# Patient Record
Sex: Male | Born: 1975 | Race: White | Hispanic: No | Marital: Single | Smoking: Never smoker
Health system: Southern US, Community
[De-identification: ages and names within clinical notes are randomized; demographics above are authoritative.]

## PROBLEM LIST (undated history)

## (undated) DIAGNOSIS — J45909 Unspecified asthma, uncomplicated: Secondary | ICD-10-CM

---

## 2014-10-17 ENCOUNTER — Emergency Department: Payer: Self-pay | Admitting: Emergency Medicine

## 2014-10-25 ENCOUNTER — Inpatient Hospital Stay (HOSPITAL_COMMUNITY)
Admission: EM | Admit: 2014-10-25 | Discharge: 2014-10-28 | DRG: 202 | Disposition: A | Discharge status: Home / Self Care | Payer: Self-pay | Attending: Internal Medicine | Admitting: Internal Medicine

## 2014-10-25 ENCOUNTER — Emergency Department (HOSPITAL_COMMUNITY): Payer: Self-pay

## 2014-10-25 ENCOUNTER — Encounter (HOSPITAL_COMMUNITY): Payer: Self-pay | Admitting: *Deleted

## 2014-10-25 DIAGNOSIS — R509 Fever, unspecified: Secondary | ICD-10-CM

## 2014-10-25 DIAGNOSIS — E876 Hypokalemia: Secondary | ICD-10-CM | POA: Diagnosis present

## 2014-10-25 DIAGNOSIS — J9601 Acute respiratory failure with hypoxia: Secondary | ICD-10-CM | POA: Diagnosis present

## 2014-10-25 DIAGNOSIS — Z791 Long term (current) use of non-steroidal anti-inflammatories (NSAID): Secondary | ICD-10-CM

## 2014-10-25 DIAGNOSIS — D72829 Elevated white blood cell count, unspecified: Secondary | ICD-10-CM | POA: Diagnosis present

## 2014-10-25 DIAGNOSIS — Z8249 Family history of ischemic heart disease and other diseases of the circulatory system: Secondary | ICD-10-CM

## 2014-10-25 DIAGNOSIS — J45901 Unspecified asthma with (acute) exacerbation: Secondary | ICD-10-CM | POA: Diagnosis present

## 2014-10-25 DIAGNOSIS — J40 Bronchitis, not specified as acute or chronic: Secondary | ICD-10-CM

## 2014-10-25 DIAGNOSIS — T380X5A Adverse effect of glucocorticoids and synthetic analogues, initial encounter: Secondary | ICD-10-CM | POA: Diagnosis present

## 2014-10-25 DIAGNOSIS — Z809 Family history of malignant neoplasm, unspecified: Secondary | ICD-10-CM

## 2014-10-25 DIAGNOSIS — J209 Acute bronchitis, unspecified: Principal | ICD-10-CM | POA: Diagnosis present

## 2014-10-25 DIAGNOSIS — Z23 Encounter for immunization: Secondary | ICD-10-CM

## 2014-10-25 HISTORY — DX: Unspecified asthma, uncomplicated: J45.909

## 2014-10-25 LAB — CBC WITH DIFFERENTIAL/PLATELET
BASOS ABS: 0 10*3/uL (ref 0.0–0.1)
BASOS PCT: 0 % (ref 0–1)
EOS PCT: 2 % (ref 0–5)
Eosinophils Absolute: 0.3 10*3/uL (ref 0.0–0.7)
HEMATOCRIT: 38.2 % — AB (ref 39.0–52.0)
Hemoglobin: 13.5 g/dL (ref 13.0–17.0)
Lymphocytes Relative: 8 % — ABNORMAL LOW (ref 12–46)
Lymphs Abs: 1 10*3/uL (ref 0.7–4.0)
MCH: 29.9 pg (ref 26.0–34.0)
MCHC: 35.3 g/dL (ref 30.0–36.0)
MCV: 84.5 fL (ref 78.0–100.0)
MONO ABS: 1.3 10*3/uL — AB (ref 0.1–1.0)
Monocytes Relative: 10 % (ref 3–12)
Neutro Abs: 11 10*3/uL — ABNORMAL HIGH (ref 1.7–7.7)
Neutrophils Relative %: 80 % — ABNORMAL HIGH (ref 43–77)
Platelets: 265 10*3/uL (ref 150–400)
RBC: 4.52 MIL/uL (ref 4.22–5.81)
RDW: 12.2 % (ref 11.5–15.5)
WBC: 13.6 10*3/uL — ABNORMAL HIGH (ref 4.0–10.5)

## 2014-10-25 LAB — COMPREHENSIVE METABOLIC PANEL
ALBUMIN: 2.9 g/dL — AB (ref 3.5–5.2)
ALT: 19 U/L (ref 0–53)
AST: 23 U/L (ref 0–37)
Alkaline Phosphatase: 49 U/L (ref 39–117)
Anion gap: 6 (ref 5–15)
BUN: 10 mg/dL (ref 6–23)
CALCIUM: 8.4 mg/dL (ref 8.4–10.5)
CO2: 29 mmol/L (ref 19–32)
Chloride: 105 mEq/L (ref 96–112)
Creatinine, Ser: 0.78 mg/dL (ref 0.50–1.35)
GFR calc Af Amer: >90 mL/min (ref >90)
GFR calc non Af Amer: >90 mL/min (ref >90)
Glucose, Bld: 102 mg/dL — ABNORMAL HIGH (ref 70–99)
Potassium: 3 mmol/L — ABNORMAL LOW (ref 3.5–5.1)
Sodium: 140 mmol/L (ref 135–145)
TOTAL PROTEIN: 6.9 g/dL (ref 6.0–8.3)
Total Bilirubin: 0.5 mg/dL (ref 0.3–1.2)

## 2014-10-25 LAB — TROPONIN I

## 2014-10-25 LAB — BRAIN NATRIURETIC PEPTIDE: B NATRIURETIC PEPTIDE 5: 95 pg/mL (ref 0.0–100.0)

## 2014-10-25 LAB — RAPID STREP SCREEN (MED CTR MEBANE ONLY): Streptococcus, Group A Screen (Direct): NEGATIVE

## 2014-10-25 MED ORDER — IPRATROPIUM-ALBUTEROL 0.5-2.5 (3) MG/3ML IN SOLN
3.0000 mL | Freq: Once | RESPIRATORY_TRACT | Status: AC
  Administered 2014-10-25: 3 mL via RESPIRATORY_TRACT
  Filled 2014-10-25: qty 3

## 2014-10-25 MED ORDER — POTASSIUM CHLORIDE CRYS ER 20 MEQ PO TBCR
40.0000 meq | EXTENDED_RELEASE_TABLET | Freq: Once | ORAL | Status: AC
  Administered 2014-10-26: 40 meq via ORAL
  Filled 2014-10-25: qty 2

## 2014-10-25 MED ORDER — LEVOFLOXACIN IN D5W 500 MG/100ML IV SOLN
500.0000 mg | Freq: Once | INTRAVENOUS | Status: AC
  Administered 2014-10-26: 500 mg via INTRAVENOUS
  Filled 2014-10-25: qty 100

## 2014-10-25 NOTE — ED Notes (Signed)
Pt dx with bronchitis last week at [place], + cough and fever at home, ibuprofen 400-600mg   An hour ago

## 2014-10-25 NOTE — ED Notes (Signed)
Walked patient around nurses station twice, sats dropped to 84% and heart rate was 134. Made doctor aware placed patient back on O2.

## 2014-10-25 NOTE — ED Provider Notes (Signed)
CSN: [id number]     Arrival date & time 10/25/14  1913 History  This chart was scribed for Glynn OctaveStephen Elisabetta Mishra, MD by Luisa DagoPriscilla Tutu, ED Scribe. This patient was seen in room APA17/APA17 and the patient's care was started at 8:10 PM.    Chief Complaint  Patient presents with  . Fever   HPI HPI Comments: Alton Revereimothy Gilcrest is a 38 y.o. male who presents to the Emergency Department complaining of gradual onset intermittent fever that started today. He states that he was diagnosed with mild bronchitis while he was at [facility] 1 week ago. Pt was discharged home with cough syrup (promethazine DM Syp) and Ibuprofen He endorses an associated cough. Last measured TMAX prior to arrival was 100. Current ED temperature is 98.6. He endorses associated upper abdominal pain secondary to forceful cough. Pt reports taking 400-600mg  Ibuprofen, with last dose approximately 1 hour ago. Positive sore throat, diaphoresis, and hoarseness. Denies any tobacco usage, recent travels outside the country, getting flu shot this season, HA, diarrhea, constipation, otalgia, visual disturbances, chest pain, or emesis.   Past Medical History  Diagnosis Date  . Asthma    History reviewed. No pertinent past surgical history. Family History  Problem Relation Age of Onset  . Hypertension Father   . Cancer Mother   . Heart attack Other    History  Substance Use Topics  . Smoking status: Never Smoker   . Smokeless tobacco: Not on file  . Alcohol Use: Yes    Review of Systems A complete 10 system review of systems was obtained and all systems are negative except as noted in the HPI and PMH.     Allergies  Review of patient's allergies indicates no known allergies.  Home Medications   Prior to Admission medications   Medication Sig Start Date End Date Taking? Authorizing Provider  DM-Doxylamine-Acetaminophen (NYQUIL COLD & FLU PO) Take 15 mLs by mouth daily as needed (for cough).   Yes Historical Provider, MD  ibuprofen  (ADVIL,MOTRIN) 200 MG tablet Take 800 mg by mouth every 6 (six) hours as needed for fever, mild pain or moderate pain.   Yes Historical Provider, MD  promethazine-dextromethorphan (PROMETHAZINE-DM) 6.25-15 MG/5ML syrup Take 5 mLs by mouth 4 (four) times daily as needed for cough.   Yes Historical Provider, MD   Triage Vitals: BP 146/82 mmHg  Pulse 108  Temp(Src) 98.6 F (37 C) (Oral)  Resp 24  Ht 5\' 8"  (1.727 m)  Wt 215 lb (97.523 kg)  BMI 32.70 kg/m2  SpO2 90%  Physical Exam  Constitutional: He is oriented to person, place, and time. He appears well-developed and well-nourished. No distress.  Hoarse voice  HENT:  Head: Normocephalic and atraumatic.  Mouth/Throat: Oropharynx is clear and moist. No oropharyngeal exudate.  Eyes: Conjunctivae and EOM are normal. Pupils are equal, round, and reactive to light.  Neck: Normal range of motion. Neck supple.  No meningismus.  Cardiovascular: Normal rate, regular rhythm, normal heart sounds and intact distal pulses.   No murmur heard. Pulmonary/Chest: Effort normal and breath sounds normal. No respiratory distress. He has no wheezes. He exhibits no tenderness.  Dry cough. No wheezing.  Abdominal: Soft. There is no tenderness. There is no rebound and no guarding.  Musculoskeletal: Normal range of motion. He exhibits no edema or tenderness.  Neurological: He is alert and oriented to person, place, and time. No cranial nerve deficit. He exhibits normal muscle tone. Coordination normal.  No ataxia on finger to nose bilaterally. No pronator drift.  5/5 strength throughout. CN 2-12 intact. Negative Romberg. Equal grip strength. Sensation intact. Gait is normal.   Skin: Skin is warm.  Psychiatric: He has a normal mood and affect. His behavior is normal.  Nursing note and vitals reviewed.   ED Course  Procedures (including critical care time)  DIAGNOSTIC STUDIES: Oxygen Saturation is 90% on RA, low by my interpretation.    COORDINATION OF  CARE: 8:14 PM-Will order CXR. Pt advised of plan for treatment and pt agrees.  Labs Review Labs Reviewed  CBC WITH DIFFERENTIAL - Abnormal; Notable for the following:    WBC 13.6 (*)    HCT 38.2 (*)    Neutrophils Relative % 80 (*)    Neutro Abs 11.0 (*)    Lymphocytes Relative 8 (*)    Monocytes Absolute 1.3 (*)    All other components within normal limits  COMPREHENSIVE METABOLIC PANEL - Abnormal; Notable for the following:    Potassium 3.0 (*)    Glucose, Bld 102 (*)    Albumin 2.9 (*)    All other components within normal limits  RAPID STREP SCREEN  CULTURE, GROUP A STREP  RESPIRATORY VIRUS PANEL  CULTURE, BLOOD (ROUTINE X 2)  CULTURE, BLOOD (ROUTINE X 2)  BRAIN NATRIURETIC PEPTIDE  TROPONIN I  MAGNESIUM  COMPREHENSIVE METABOLIC PANEL  CBC WITH DIFFERENTIAL    Imaging Review Dg Chest 2 View  10/25/2014   CLINICAL DATA:  Fever and nonproductive cough for 9 days. Patient reports a history of asthma.  EXAM: CHEST  2 VIEW  COMPARISON:  None.  FINDINGS: The heart size and mediastinal contours are within normal limits. Both lungs are clear. The visualized skeletal structures are unremarkable. Mild central bronchial wall thickening is evident.  IMPRESSION: Mild central bronchial wall thickening which may be related to the history of asthma or bronchitis.   Electronically Signed   By: Christiana PellantGretchen  Green M.D.   On: 10/25/2014 20:19     EKG Interpretation None      MDM   Final diagnoses:  Bronchitis  Asthma exacerbation   3 day history of cough, congestion, fever at home. Seen in [place] ER 2 days ago and diagnosed with bronchitis. Endorses chest pain with coughing.  Hoarse voice, lungs clear, no distress CXR with bronchial thickening.  Nebs, steroids.  Good air exchange but hypoxic to 90% at rest.  84% with ambulation.  With hypoxia, will admit for continued treatments and O2 supplements.  D/w Dr. Alvester MorinNewton.   I personally performed the services described in this  documentation, which was scribed in my presence. The recorded information has been reviewed and is accurate.   Glynn OctaveStephen Hutson Luft, MD 10/26/14 [phone]

## 2014-10-26 ENCOUNTER — Encounter (HOSPITAL_COMMUNITY): Payer: Self-pay

## 2014-10-26 DIAGNOSIS — J209 Acute bronchitis, unspecified: Secondary | ICD-10-CM | POA: Diagnosis present

## 2014-10-26 DIAGNOSIS — J9601 Acute respiratory failure with hypoxia: Secondary | ICD-10-CM | POA: Diagnosis present

## 2014-10-26 LAB — CBC WITH DIFFERENTIAL/PLATELET
BASOS ABS: 0 10*3/uL (ref 0.0–0.1)
BASOS PCT: 0 % (ref 0–1)
Eosinophils Absolute: 0 10*3/uL (ref 0.0–0.7)
Eosinophils Relative: 0 % (ref 0–5)
HEMATOCRIT: 38.2 % — AB (ref 39.0–52.0)
Hemoglobin: 13.6 g/dL (ref 13.0–17.0)
Lymphocytes Relative: 3 % — ABNORMAL LOW (ref 12–46)
Lymphs Abs: 0.4 10*3/uL — ABNORMAL LOW (ref 0.7–4.0)
MCH: 31 pg (ref 26.0–34.0)
MCHC: 35.6 g/dL (ref 30.0–36.0)
MCV: 87 fL (ref 78.0–100.0)
Monocytes Absolute: 0.1 10*3/uL (ref 0.1–1.0)
Monocytes Relative: 1 % — ABNORMAL LOW (ref 3–12)
NEUTROS ABS: 11.7 10*3/uL — AB (ref 1.7–7.7)
NEUTROS PCT: 96 % — AB (ref 43–77)
PLATELETS: 279 10*3/uL (ref 150–400)
RBC: 4.39 MIL/uL (ref 4.22–5.81)
RDW: 12.2 % (ref 11.5–15.5)
WBC: 12.1 10*3/uL — AB (ref 4.0–10.5)

## 2014-10-26 LAB — COMPREHENSIVE METABOLIC PANEL
ALBUMIN: 2.7 g/dL — AB (ref 3.5–5.2)
ALT: 18 U/L (ref 0–53)
AST: 28 U/L (ref 0–37)
Alkaline Phosphatase: 50 U/L (ref 39–117)
Anion gap: 10 (ref 5–15)
BUN: 10 mg/dL (ref 6–23)
CO2: 27 mmol/L (ref 19–32)
CREATININE: 0.71 mg/dL (ref 0.50–1.35)
Calcium: 8.3 mg/dL — ABNORMAL LOW (ref 8.4–10.5)
Chloride: 101 mEq/L (ref 96–112)
GFR calc Af Amer: >90 mL/min (ref >90)
Glucose, Bld: 225 mg/dL — ABNORMAL HIGH (ref 70–99)
Potassium: 3.3 mmol/L — ABNORMAL LOW (ref 3.5–5.1)
Sodium: 138 mmol/L (ref 135–145)
Total Bilirubin: 0.6 mg/dL (ref 0.3–1.2)
Total Protein: 6.9 g/dL (ref 6.0–8.3)

## 2014-10-26 LAB — MAGNESIUM: Magnesium: 1.5 mg/dL (ref 1.5–2.5)

## 2014-10-26 LAB — HEMOGLOBIN A1C
HEMOGLOBIN A1C: 6.1 % — AB (ref <5.7)
Mean Plasma Glucose: 128 mg/dL — ABNORMAL HIGH (ref <117)

## 2014-10-26 MED ORDER — IPRATROPIUM-ALBUTEROL 0.5-2.5 (3) MG/3ML IN SOLN: 3.0000 mL | RESPIRATORY_TRACT | Status: DC | PRN

## 2014-10-26 MED ORDER — IPRATROPIUM-ALBUTEROL 0.5-2.5 (3) MG/3ML IN SOLN: 3.0000 mL | RESPIRATORY_TRACT | Status: DC

## 2014-10-26 MED ORDER — IPRATROPIUM-ALBUTEROL 0.5-2.5 (3) MG/3ML IN SOLN
3.0000 mL | Freq: Four times a day (QID) | RESPIRATORY_TRACT | Status: DC
  Administered 2014-10-26 – 2014-10-28 (×9): 3 mL via RESPIRATORY_TRACT
  Filled 2014-10-26 (×9): qty 3

## 2014-10-26 MED ORDER — HEPARIN SODIUM (PORCINE) 5000 UNIT/ML IJ SOLN: 5000.0000 [IU] | Freq: Three times a day (TID) | INTRAMUSCULAR | Status: DC

## 2014-10-26 MED ORDER — PNEUMOCOCCAL VAC POLYVALENT 25 MCG/0.5ML IJ INJ
0.5000 mL | INJECTION | INTRAMUSCULAR | Status: AC
  Administered 2014-10-27: 0.5 mL via INTRAMUSCULAR
  Filled 2014-10-26: qty 0.5

## 2014-10-26 MED ORDER — LEVOFLOXACIN IN D5W 750 MG/150ML IV SOLN
750.0000 mg | INTRAVENOUS | Status: DC
  Administered 2014-10-26 – 2014-10-27 (×2): 750 mg via INTRAVENOUS
  Filled 2014-10-26 (×2): qty 150

## 2014-10-26 MED ORDER — POTASSIUM CHLORIDE CRYS ER 20 MEQ PO TBCR: 40.0000 meq | EXTENDED_RELEASE_TABLET | Freq: Two times a day (BID) | ORAL | Status: DC

## 2014-10-26 MED ORDER — HEPARIN SODIUM (PORCINE) 5000 UNIT/ML IJ SOLN
5000.0000 [IU] | Freq: Three times a day (TID) | INTRAMUSCULAR | Status: DC
  Administered 2014-10-26 – 2014-10-28 (×8): 5000 [IU] via SUBCUTANEOUS
  Filled 2014-10-26 (×8): qty 1

## 2014-10-26 MED ORDER — INFLUENZA VAC SPLIT QUAD 0.5 ML IM SUSY
0.5000 mL | PREFILLED_SYRINGE | INTRAMUSCULAR | Status: AC
  Administered 2014-10-27: 0.5 mL via INTRAMUSCULAR
  Filled 2014-10-26: qty 0.5

## 2014-10-26 MED ORDER — POTASSIUM CHLORIDE CRYS ER 20 MEQ PO TBCR
60.0000 meq | EXTENDED_RELEASE_TABLET | Freq: Once | ORAL | Status: AC
  Administered 2014-10-26: 60 meq via ORAL
  Filled 2014-10-26: qty 3

## 2014-10-26 MED ORDER — SODIUM CHLORIDE 0.9 % IV SOLN
INTRAVENOUS | Status: DC
  Administered 2014-10-26 (×2): via INTRAVENOUS

## 2014-10-26 MED ORDER — METHYLPREDNISOLONE SODIUM SUCC 125 MG IJ SOLR
125.0000 mg | Freq: Once | INTRAMUSCULAR | Status: AC
  Filled 2014-10-26: qty 2

## 2014-10-26 MED ORDER — METHYLPREDNISOLONE SODIUM SUCC 125 MG IJ SOLR
125.0000 mg | Freq: Two times a day (BID) | INTRAMUSCULAR | Status: DC
  Administered 2014-10-26 – 2014-10-27 (×4): 125 mg via INTRAVENOUS
  Filled 2014-10-26 (×3): qty 2

## 2014-10-26 MED ORDER — GUAIFENESIN-DM 100-10 MG/5ML PO SYRP
5.0000 mL | ORAL_SOLUTION | ORAL | Status: DC | PRN
  Administered 2014-10-26 – 2014-10-28 (×11): 5 mL via ORAL
  Filled 2014-10-26 (×11): qty 5

## 2014-10-26 MED ORDER — MORPHINE SULFATE 2 MG/ML IJ SOLN
1.0000 mg | INTRAMUSCULAR | Status: DC | PRN
  Administered 2014-10-26 – 2014-10-28 (×4): 1 mg via INTRAVENOUS
  Filled 2014-10-26 (×6): qty 1

## 2014-10-26 MED ORDER — GUAIFENESIN ER 600 MG PO TB12
1200.0000 mg | ORAL_TABLET | Freq: Two times a day (BID) | ORAL | Status: DC
  Administered 2014-10-26 – 2014-10-28 (×5): 1200 mg via ORAL
  Filled 2014-10-26 (×5): qty 2

## 2014-10-26 MED ORDER — LEVOFLOXACIN IN D5W 750 MG/150ML IV SOLN: 750.0000 mg | INTRAVENOUS | Status: DC

## 2014-10-26 NOTE — Care Management Note (Signed)
    Page 1 of 1   10/26/2014     11:44:01 AM CARE MANAGEMENT NOTE 10/26/2014  Patient:  Brad RevereQUINN,Brad   Account Number:  [id number]  Date Initiated:  10/26/2014  Documentation initiated by:  Sharrie RothmanBLACKWELL,Julez Huseby C  Subjective/Objective Assessment:   Pt admitted from home with respiratory failure and asthma. Pt just moved to area within the last month with family. Pt will discharge home. Pt has no insurance or PCP.     Action/Plan:   MATCH voucher will be left on pts chart for assistance with discharge meds. If pt needs neb machine [facility] Attempted to call [facility] for followup but office is closed for holiday. [facility] not [facility]   Anticipated DC Date:  10/27/2014   Anticipated DC Plan:  HOME/SELF CARE      DC Planning Services  CM consult  MATCH Program      Choice offered to / List presented to:             Status of service:  Completed, signed off Medicare Important Message given?   (If response is "NO", the following Medicare IM given date fields will be blank) Date Medicare IM given:   Medicare IM given by:   Date Additional Medicare IM given:   Additional Medicare IM given by:    Discharge Disposition:  HOME/SELF CARE  Per UR Regulation:    If discussed at Long Length of Stay Meetings, dates discussed:    Comments:  10/26/14 1115 Brad Strickland Brad Pepperman, RN BSN CM [place]. MD aware of barriers at discharge. MD does not anticipate pt will need home O2 at discharge.

## 2014-10-26 NOTE — H&P (Addendum)
Hospitalist Admission History and Physical  Patient name: Brad Strickland Medical record number: [id number] Date of birth: July 06, 1976 Age: 38 y.o. Gender: male  Primary Care Provider: No primary care provider on file.  Chief Complaint: acute resp failure w/ hypoxia, bronchitis, asthma exacerbation   History of Present Illness:This is a 38 y.o. year old male with significant past medical history of asthma presenting with acute resp failure w/ hypoxia, bronchitis, asthma exacerbation. Pt states that [facility] for [facility]. Had temp there >101. States that temp normalized and was d/c'd on ibuprofen and cough medicine. Denies being abx , steroid or inhaler. Reports remote hx/o asthma as a child. No recent inhaler use, though does report some intermittent wheezing at times. Worsening cough, wheezing and SOB at home. No flu shot this year. Nonsmoker.  Presented APER today T 98.6, HR 90s-100s, resp 20s, BP 130s-150s, Satting in low 90s on RA. Desatting to upper 80s on ambulation. WBC 13.6, hgb 13.5, Cr 0.78, K 3.0. CXR shows mild central bronchial thickening concerning for asthma/bronchitis. No true infiltrate.   Assessment and Plan: Brad Strickland is a 38 y.o. year old male presenting with Acute resp failure w/ hypoxia, bronchitis   Active Problems:   Bronchitis   Acute respiratory failure with hypoxia   1- Acute resp failure w/ hypoxia  -supplemental O2 prn  -IV solumedrol  -IV levaquin -Duonebs  -resp virus panel (no flu shot this year) -follow closely   2- Hypokalemia -replete  -check mag level   FEN/GI: heart healthy  Prophylaxis: sub q heparin  Disposition: pending further evaluation Code Status: Full Code    Patient Active Problem List   Diagnosis Date Noted  . Bronchitis 10/26/2014  . Acute respiratory failure with hypoxia 10/26/2014   Past Medical History: Past Medical History  Diagnosis Date  . Asthma     Past Surgical History: History reviewed.  No pertinent past surgical history.  Social History: History   Social History  . Marital Status: Single    Spouse Name: N/A    Number of Children: N/A  . Years of Education: N/A   Social History Main Topics  . Smoking status: Never Smoker   . Smokeless tobacco: None  . Alcohol Use: Yes  . Drug Use: No  . Sexual Activity: None   Other Topics Concern  . None   Social History Narrative  . None    Family History: History reviewed. No pertinent family history.  Allergies: No Known Allergies  Current Facility-Administered Medications  Medication Dose Route Frequency Provider Last Rate Last Dose  . 0.9 %  sodium chloride infusion   Intravenous Continuous Doree AlbeeSteven Kanitra Purifoy, MD      . heparin injection 5,000 Units  5,000 Units Subcutaneous 3 times per day Doree AlbeeSteven Jimesha Rising, MD      . ipratropium-albuterol (DUONEB) 0.5-2.5 (3) MG/3ML nebulizer solution 3 mL  3 mL Nebulization Q4H Doree AlbeeSteven Aurelius Gildersleeve, MD      . ipratropium-albuterol (DUONEB) 0.5-2.5 (3) MG/3ML nebulizer solution 3 mL  3 mL Nebulization Q2H PRN Doree AlbeeSteven Celisa Schoenberg, MD      . levofloxacin (LEVAQUIN) IVPB 500 mg  500 mg Intravenous Once Glynn OctaveStephen Rancour, MD      . levofloxacin (LEVAQUIN) IVPB 750 mg  750 mg Intravenous Q24H Doree AlbeeSteven Glynda Soliday, MD      . methylPREDNISolone sodium succinate (SOLU-MEDROL) 125 mg/2 mL injection 125 mg  125 mg Intravenous Once Glynn OctaveStephen Rancour, MD      . methylPREDNISolone sodium succinate (SOLU-MEDROL) 125  mg/2 mL injection 125 mg  125 mg Intravenous Q12H Doree AlbeeSteven Beyza Bellino, MD      . potassium chloride SA (K-DUR,KLOR-CON) CR tablet 40 mEq  40 mEq Oral Once Glynn OctaveStephen Rancour, MD       Current Outpatient Prescriptions  Medication Sig Dispense Refill  . DM-Doxylamine-Acetaminophen (NYQUIL COLD & FLU PO) Take 15 mLs by mouth daily as needed (for cough).    Marland Kitchen. ibuprofen (ADVIL,MOTRIN) 200 MG tablet Take 800 mg by mouth every 6 (six) hours as needed for fever, mild pain or moderate pain.    . promethazine-dextromethorphan  (PROMETHAZINE-DM) 6.25-15 MG/5ML syrup Take 5 mLs by mouth 4 (four) times daily as needed for cough.     Review Of Systems: 12 point ROS negative except as noted above in HPI.  Physical Exam: Filed Vitals:   10/25/14 2319  BP: 152/96  Pulse: 94  Temp:   Resp: 22    General: alert and cooperative HEENT: PERRLA, extra ocular movement intact and hoarse voice Heart: S1, S2 normal, no murmur, rub or gallop, regular rate and rhythm Lungs: faint wheezes diffusely, minimal increased WOB  Abdomen: abdomen is soft without significant tenderness, masses, organomegaly or guarding Extremities: extremities normal, atraumatic, no cyanosis or edema Skin:no rashes, no ecchymoses Neurology: normal without focal findings  Labs and Imaging: Lab Results  Component Value Date/Time   NA 140 10/25/2014 08:32 PM   K 3.0* 10/25/2014 08:32 PM   CL 105 10/25/2014 08:32 PM   CO2 29 10/25/2014 08:32 PM   BUN 10 10/25/2014 08:32 PM   CREATININE 0.78 10/25/2014 08:32 PM   GLUCOSE 102* 10/25/2014 08:32 PM   Lab Results  Component Value Date   WBC 13.6* 10/25/2014   HGB 13.5 10/25/2014   HCT 38.2* 10/25/2014   MCV 84.5 10/25/2014   PLT 265 10/25/2014    Dg Chest 2 View  10/25/2014   CLINICAL DATA:  Fever and nonproductive cough for 9 days. Patient reports a history of asthma.  EXAM: CHEST  2 VIEW  COMPARISON:  None.  FINDINGS: The heart size and mediastinal contours are within normal limits. Both lungs are clear. The visualized skeletal structures are unremarkable. Mild central bronchial wall thickening is evident.  IMPRESSION: Mild central bronchial wall thickening which may be related to the history of asthma or bronchitis.   Electronically Signed   By: Christiana PellantGretchen  Green M.D.   On: 10/25/2014 20:19           Doree AlbeeSteven Alam Guterrez MD  Pager: [phone]

## 2014-10-26 NOTE — Progress Notes (Signed)
UR completed 

## 2014-10-26 NOTE — Progress Notes (Signed)
[facility] PROGRESS NOTE   Brad Strickland AVW:[id number] DOB: August 19, 1976 DOA: 10/25/2014 PCP: No primary care provider on file.  HPI/Subjective: Low-grade fever of 99.7, still has cough, shortness of breath and mild wheezing.  Assessment/Plan: Active Problems:   Bronchitis   Acute respiratory failure with hypoxia    Acute hypoxic respiratory failure Secondary to acute bronchitis/early pneumonia. Patient oxygen saturation dropped to 84% with ambulation and heart rate of 134. Currently on 2 L of oxygen saturating well, treat acute bronchitis/pneumonia.  Acute bronchitis Patient had symptoms for about 9-10 days, with low-grade temperature, cough and sputum production. Started on levofloxacin. Urine for Legionella and Streptococcus antigen. Supportive treatment with bronchodilators, antitussives, oxygen and mucolytics.  Code Status: Full code Family Communication: Plan discussed with the patient. Disposition Plan: Remains inpatient   Consultants:  None  Procedures:  None  Antibiotics:  Levofloxacin   Objective: Filed Vitals:   10/26/14 0609  BP: 141/66  Pulse: 82  Temp: 98.6 F (37 C)  Resp: 20    Intake/Output Summary (Last 24 hours) at 10/26/14 1033 Last data filed at 10/26/14 0954  Gross per 24 hour  Intake  727.5 ml  Output      0 ml  Net  727.5 ml   Filed Weights   10/25/14 1926  Weight: 97.523 kg (215 lb)    Exam: General: Alert and awake, oriented x3, not in any acute distress. HEENT: anicteric sclera, pupils reactive to light and accommodation, EOMI CVS: S1-S2 clear, no murmur rubs or gallops Chest: clear to auscultation bilaterally, no wheezing, rales or rhonchi Abdomen: soft nontender, nondistended, normal bowel sounds, no organomegaly Extremities: no cyanosis, clubbing or edema noted bilaterally Neuro: Cranial nerves II-XII intact, no focal neurological deficits  Data Reviewed: Basic Metabolic Panel:  Recent Labs Lab  10/25/14 2032 10/26/14 0049 10/26/14 0628  NA 140  --  138  K 3.0*  --  3.3*  CL 105  --  101  CO2 29  --  27  GLUCOSE 102*  --  225*  BUN 10  --  10  CREATININE 0.78  --  0.71  CALCIUM 8.4  --  8.3*  MG  --  1.5  --    Liver Function Tests:  Recent Labs Lab 10/25/14 2032 10/26/14 0628  AST 23 28  ALT 19 18  ALKPHOS 49 50  BILITOT 0.5 0.6  PROT 6.9 6.9  ALBUMIN 2.9* 2.7*   No results for input(s): LIPASE, AMYLASE in the last 168 hours. No results for input(s): AMMONIA in the last 168 hours. CBC:  Recent Labs Lab 10/25/14 2032 10/26/14 0628  WBC 13.6* 12.1*  NEUTROABS 11.0* 11.7*  HGB 13.5 13.6  HCT 38.2* 38.2*  MCV 84.5 87.0  PLT 265 279   Cardiac Enzymes:  Recent Labs Lab 10/25/14 2032  TROPONINI <0.03   BNP (last 3 results) No results for input(s): PROBNP in the last 8760 hours. CBG: No results for input(s): GLUCAP in the last 168 hours.  Micro Recent Results (from the past 240 hour(s))  Rapid strep screen     Status: None   Collection Time: 10/25/14  8:21 PM  Result Value Ref Range Status   Streptococcus, Group Strickland Screen (Direct) NEGATIVE NEGATIVE Final    Comment: (NOTE) Strickland Rapid Antigen test may result negative if the antigen level in the sample is below the detection level of this test. The FDA has not cleared this test as Strickland stand-alone test therefore the rapid antigen negative result has reflexed  to Strickland Group Strickland Strep culture.   Culture, blood (routine x 2)     Status: None (Preliminary result)   Collection Time: 10/26/14 12:49 AM  Result Value Ref Range Status   Specimen Description BLOOD RIGHT ANTECUBITAL  Final   Special Requests   Final    BOTTLES DRAWN AEROBIC AND ANAEROBIC AEB 8CC ANA 6CC   Culture NO GROWTH <24 HRS  Final   Report Status PENDING  Incomplete  Culture, blood (routine x 2)     Status: None (Preliminary result)   Collection Time: 10/26/14 12:49 AM  Result Value Ref Range Status   Specimen Description BLOOD RIGHT HAND   Final   Special Requests BOTTLES DRAWN AEROBIC AND ANAEROBIC 6CC EACH  Final   Culture NO GROWTH <24 HRS  Final   Report Status PENDING  Incomplete     Studies: Dg Chest 2 View  10/25/2014   CLINICAL DATA:  Fever and nonproductive cough for 9 days. Patient reports Strickland history of asthma.  EXAM: CHEST  2 VIEW  COMPARISON:  None.  FINDINGS: The heart size and mediastinal contours are within normal limits. Both lungs are clear. The visualized skeletal structures are unremarkable. Mild central bronchial wall thickening is evident.  IMPRESSION: Mild central bronchial wall thickening which may be related to the history of asthma or bronchitis.   Electronically Signed   By: Christiana PellantGretchen  Strickland M.D.   On: 10/25/2014 20:19    Scheduled Meds: . heparin  5,000 Units Subcutaneous 3 times per day  . [START ON 10/27/2014] Influenza vac split quadrivalent PF  0.5 mL Intramuscular Tomorrow-1000  . ipratropium-albuterol  3 mL Nebulization Q6H  . levofloxacin (LEVAQUIN) IV  750 mg Intravenous Q24H  . methylPREDNISolone (SOLU-MEDROL) injection  125 mg Intravenous Q12H  . [START ON 10/27/2014] pneumococcal 23 valent vaccine  0.5 mL Intramuscular Tomorrow-1000  . potassium chloride  40 mEq Oral BID   Continuous Infusions: . sodium chloride 75 mL/hr at 10/26/14 0151       Time spent: 35 minutes    [facility] Hospital[facility]MAHI,Brad Strickland  [facility] Pager [phone] If 7PM-7AM, please contact night-coverage at [email or web address], password [email or web address] 10/26/2014, 10:33 AM  LOS: 1 day

## 2014-10-27 DIAGNOSIS — J209 Acute bronchitis, unspecified: Principal | ICD-10-CM

## 2014-10-27 DIAGNOSIS — J9601 Acute respiratory failure with hypoxia: Secondary | ICD-10-CM

## 2014-10-27 DIAGNOSIS — D72829 Elevated white blood cell count, unspecified: Secondary | ICD-10-CM | POA: Diagnosis present

## 2014-10-27 LAB — CBC WITH DIFFERENTIAL/PLATELET
Basophils Absolute: 0.1 10*3/uL (ref 0.0–0.1)
Basophils Relative: 0 % (ref 0–1)
EOS ABS: 0 10*3/uL (ref 0.0–0.7)
Eosinophils Relative: 0 % (ref 0–5)
HCT: 37.4 % — ABNORMAL LOW (ref 39.0–52.0)
HEMOGLOBIN: 13.7 g/dL (ref 13.0–17.0)
LYMPHS ABS: 1.2 10*3/uL (ref 0.7–4.0)
LYMPHS PCT: 4 % — AB (ref 12–46)
MCH: 32.5 pg (ref 26.0–34.0)
MCHC: 36.6 g/dL — ABNORMAL HIGH (ref 30.0–36.0)
MCV: 88.6 fL (ref 78.0–100.0)
MONOS PCT: 4 % (ref 3–12)
Monocytes Absolute: 1.4 10*3/uL — ABNORMAL HIGH (ref 0.1–1.0)
NEUTROS PCT: 92 % — AB (ref 43–77)
Neutro Abs: 30.4 10*3/uL — ABNORMAL HIGH (ref 1.7–7.7)
PLATELETS: 346 10*3/uL (ref 150–400)
RBC: 4.22 MIL/uL (ref 4.22–5.81)
RDW: 12.6 % (ref 11.5–15.5)
WBC: 33 10*3/uL — AB (ref 4.0–10.5)

## 2014-10-27 LAB — COMPREHENSIVE METABOLIC PANEL
ALT: 22 U/L (ref 0–53)
AST: 36 U/L (ref 0–37)
Albumin: 2.6 g/dL — ABNORMAL LOW (ref 3.5–5.2)
Alkaline Phosphatase: 52 U/L (ref 39–117)
Anion gap: 10 (ref 5–15)
BUN: 15 mg/dL (ref 6–23)
CALCIUM: 8.7 mg/dL (ref 8.4–10.5)
CO2: 25 mmol/L (ref 19–32)
CREATININE: 0.75 mg/dL (ref 0.50–1.35)
Chloride: 102 mEq/L (ref 96–112)
GFR calc Af Amer: >90 mL/min (ref >90)
Glucose, Bld: 147 mg/dL — ABNORMAL HIGH (ref 70–99)
Potassium: 4 mmol/L (ref 3.5–5.1)
Sodium: 137 mmol/L (ref 135–145)
Total Bilirubin: 0.7 mg/dL (ref 0.3–1.2)
Total Protein: 6.5 g/dL (ref 6.0–8.3)

## 2014-10-27 LAB — CULTURE, GROUP A STREP

## 2014-10-27 MED ORDER — ACETAMINOPHEN 325 MG PO TABS
650.0000 mg | ORAL_TABLET | Freq: Four times a day (QID) | ORAL | Status: DC | PRN
  Administered 2014-10-27: 650 mg via ORAL
  Filled 2014-10-27: qty 2

## 2014-10-27 MED ORDER — METHYLPREDNISOLONE SODIUM SUCC 125 MG IJ SOLR
60.0000 mg | Freq: Two times a day (BID) | INTRAMUSCULAR | Status: DC
  Administered 2014-10-28 (×2): 60 mg via INTRAVENOUS
  Filled 2014-10-27 (×2): qty 2

## 2014-10-27 MED ORDER — ONDANSETRON HCL 4 MG/2ML IJ SOLN: 4.0000 mg | Freq: Four times a day (QID) | INTRAMUSCULAR | Status: DC | PRN

## 2014-10-27 NOTE — Progress Notes (Signed)
[facility] PROGRESS NOTE  Brad Revereimothy Ferdig RUE:[id number] DOB: Apr 13, 1976 DOA: 10/25/2014 PCP: No primary care provider on file.  Assessment/Plan: 1. Acute hypoxic respiratory failure. Felt to be related to acute bronchitis. Continue to wean oxygen as tolerated. 2. Acute bronchitis. Continue on levofloxacin. Continue supportive measures including bronchodilators, antitussives, oxygen and mucolytics. He is also on steroids which we will start to de-escalate 3. Leukocytosis. Likely related to steroids. It does not appear to be septic or toxic.  Code Status: Full code Family Communication: Discussed with patient  Disposition Plan: Discharge home, hopefully in a.m.   Consultants:    Procedures:    Antibiotics:  Levofloxacin 12/24>>  HPI/Subjective: Still feels short of breath. Has difficulty expectorating sputum  Objective: Filed Vitals:   10/27/14 0617  BP: 139/72  Pulse: 88  Temp: 97.8 F (36.6 C)  Resp: 20    Intake/Output Summary (Last 24 hours) at 10/27/14 1852 Last data filed at 10/27/14 0900  Gross per 24 hour  Intake    240 ml  Output    700 ml  Net   -460 ml   Filed Weights   10/25/14 1926  Weight: 97.523 kg (215 lb)    Exam:   General:  No acute distress  Cardiovascular: S1, S2, regular rate and rhythm  Respiratory: Crackles at left base  Abdomen: Soft, nontender, positive bowel sounds  Musculoskeletal: No pedal edema   Data Reviewed: Basic Metabolic Panel:  Recent Labs Lab 10/25/14 2032 10/26/14 0049 10/26/14 0628 10/27/14 0637  NA 140  --  138 137  K 3.0*  --  3.3* 4.0  CL 105  --  101 102  CO2 29  --  27 25  GLUCOSE 102*  --  225* 147*  BUN 10  --  10 15  CREATININE 0.78  --  0.71 0.75  CALCIUM 8.4  --  8.3* 8.7  MG  --  1.5  --   --    Liver Function Tests:  Recent Labs Lab 10/25/14 2032 10/26/14 0628 10/27/14 0637  AST 23 28 36  ALT 19 18 22   ALKPHOS 49 50 52  BILITOT 0.5 0.6 0.7  PROT 6.9 6.9 6.5  ALBUMIN  2.9* 2.7* 2.6*   No results for input(s): LIPASE, AMYLASE in the last 168 hours. No results for input(s): AMMONIA in the last 168 hours. CBC:  Recent Labs Lab 10/25/14 2032 10/26/14 0628 10/27/14 0637  WBC 13.6* 12.1* 33.0*  NEUTROABS 11.0* 11.7* 30.4*  HGB 13.5 13.6 13.7  HCT 38.2* 38.2* 37.4*  MCV 84.5 87.0 88.6  PLT 265 279 346   Cardiac Enzymes:  Recent Labs Lab 10/25/14 2032  TROPONINI <0.03   BNP (last 3 results) No results for input(s): PROBNP in the last 8760 hours. CBG: No results for input(s): GLUCAP in the last 168 hours.  Recent Results (from the past 240 hour(s))  Rapid strep screen     Status: None   Collection Time: 10/25/14  8:21 PM  Result Value Ref Range Status   Streptococcus, Group A Screen (Direct) NEGATIVE NEGATIVE Final    Comment: (NOTE) A Rapid Antigen test may result negative if the antigen level in the sample is below the detection level of this test. The FDA has not cleared this test as a stand-alone test therefore the rapid antigen negative result has reflexed to a Group A Strep culture.   Culture, Group A Strep     Status: None   Collection Time: 10/25/14  8:21 PM  Result  Value Ref Range Status   Specimen Description THROAT  Final   Special Requests NONE  Final   Culture   Final    No Beta Hemolytic Streptococci Isolated Performed at [facility]    Report Status 10/27/2014 FINAL  Final  Culture, blood (routine x 2)     Status: None (Preliminary result)   Collection Time: 10/26/14 12:49 AM  Result Value Ref Range Status   Specimen Description BLOOD RIGHT ANTECUBITAL  Final   Special Requests   Final    BOTTLES DRAWN AEROBIC AND ANAEROBIC AEB 8CC ANA 6CC   Culture NO GROWTH <24 HRS  Final   Report Status PENDING  Incomplete  Culture, blood (routine x 2)     Status: None (Preliminary result)   Collection Time: 10/26/14 12:49 AM  Result Value Ref Range Status   Specimen Description BLOOD RIGHT HAND  Final   Special  Requests BOTTLES DRAWN AEROBIC AND ANAEROBIC 6CC EACH  Final   Culture NO GROWTH <24 HRS  Final   Report Status PENDING  Incomplete     Studies: Dg Chest 2 View  10/25/2014   CLINICAL DATA:  Fever and nonproductive cough for 9 days. Patient reports a history of asthma.  EXAM: CHEST  2 VIEW  COMPARISON:  None.  FINDINGS: The heart size and mediastinal contours are within normal limits. Both lungs are clear. The visualized skeletal structures are unremarkable. Mild central bronchial wall thickening is evident.  IMPRESSION: Mild central bronchial wall thickening which may be related to the history of asthma or bronchitis.   Electronically Signed   By: Christiana PellantGretchen  Green M.D.   On: 10/25/2014 20:19    Scheduled Meds: . guaiFENesin  1,200 mg Oral BID  . heparin  5,000 Units Subcutaneous 3 times per day  . ipratropium-albuterol  3 mL Nebulization Q6H  . levofloxacin (LEVAQUIN) IV  750 mg Intravenous Q24H  . [START ON 10/28/2014] methylPREDNISolone (SOLU-MEDROL) injection  60 mg Intravenous Q12H   Continuous Infusions:   Principal Problem:   Acute respiratory failure with hypoxia Active Problems:   Acute bronchitis   Leukocytosis    Time spent: 30min    MEMON,JEHANZEB  [facility] Pager [phone]. If 7PM-7AM, please contact night-coverage at [email or web address], password [email or web address] 10/27/2014, 6:52 PM  LOS: 2 days

## 2014-10-27 NOTE — Plan of Care (Signed)
Problem: Phase I Progression Outcomes Goal: Flu/PneumoVaccines if indicated Outcome: Completed/Met Date Met:  10/27/14 Both given today 10/27/14

## 2014-10-28 LAB — COMPREHENSIVE METABOLIC PANEL
ALK PHOS: 43 U/L (ref 39–117)
ALT: 29 U/L (ref 0–53)
AST: 28 U/L (ref 0–37)
Albumin: 2.5 g/dL — ABNORMAL LOW (ref 3.5–5.2)
Anion gap: 7 (ref 5–15)
BUN: 17 mg/dL (ref 6–23)
CO2: 29 mmol/L (ref 19–32)
Calcium: 8.5 mg/dL (ref 8.4–10.5)
Chloride: 103 mEq/L (ref 96–112)
Creatinine, Ser: 0.78 mg/dL (ref 0.50–1.35)
GLUCOSE: 138 mg/dL — AB (ref 70–99)
Potassium: 3.8 mmol/L (ref 3.5–5.1)
Sodium: 139 mmol/L (ref 135–145)
Total Bilirubin: 0.4 mg/dL (ref 0.3–1.2)
Total Protein: 5.8 g/dL — ABNORMAL LOW (ref 6.0–8.3)

## 2014-10-28 LAB — CBC WITH DIFFERENTIAL/PLATELET
BASOS ABS: 0 10*3/uL (ref 0.0–0.1)
Basophils Relative: 0 % (ref 0–1)
EOS ABS: 0 10*3/uL (ref 0.0–0.7)
EOS PCT: 0 % (ref 0–5)
HCT: 36 % — ABNORMAL LOW (ref 39.0–52.0)
Hemoglobin: 12.6 g/dL — ABNORMAL LOW (ref 13.0–17.0)
LYMPHS ABS: 1 10*3/uL (ref 0.7–4.0)
Lymphocytes Relative: 5 % — ABNORMAL LOW (ref 12–46)
MCH: 30.5 pg (ref 26.0–34.0)
MCHC: 35 g/dL (ref 30.0–36.0)
MCV: 87.2 fL (ref 78.0–100.0)
MONO ABS: 1 10*3/uL (ref 0.1–1.0)
Monocytes Relative: 5 % (ref 3–12)
NEUTROS PCT: 90 % — AB (ref 43–77)
Neutro Abs: 17.4 10*3/uL — ABNORMAL HIGH (ref 1.7–7.7)
PLATELETS: 305 10*3/uL (ref 150–400)
RBC: 4.13 MIL/uL — ABNORMAL LOW (ref 4.22–5.81)
RDW: 12.7 % (ref 11.5–15.5)
WBC: 19.4 10*3/uL — ABNORMAL HIGH (ref 4.0–10.5)

## 2014-10-28 MED ORDER — PREDNISONE 10 MG PO TABS: ORAL_TABLET | ORAL | Status: AC

## 2014-10-28 MED ORDER — SODIUM CHLORIDE 0.9 % IJ SOLN
3.0000 mL | Freq: Two times a day (BID) | INTRAMUSCULAR | Status: DC
  Administered 2014-10-28: 3 mL via INTRAVENOUS

## 2014-10-28 MED ORDER — HYDROCODONE-ACETAMINOPHEN 5-325 MG PO TABS
1.0000 | ORAL_TABLET | ORAL | Status: AC
  Administered 2014-10-28: 1 via ORAL
  Filled 2014-10-28: qty 1

## 2014-10-28 MED ORDER — LEVOFLOXACIN 750 MG PO TABS: 750.0000 mg | ORAL_TABLET | Freq: Every day | ORAL | Status: AC

## 2014-10-28 MED ORDER — HYDROCODONE-HOMATROPINE 5-1.5 MG/5ML PO SYRP: 5.0000 mL | ORAL_SOLUTION | Freq: Four times a day (QID) | ORAL | Status: AC | PRN

## 2014-10-28 MED ORDER — ALBUTEROL SULFATE HFA 108 (90 BASE) MCG/ACT IN AERS: 2.0000 | INHALATION_SPRAY | RESPIRATORY_TRACT | Status: AC | PRN

## 2014-10-28 MED ORDER — SODIUM CHLORIDE 0.9 % IJ SOLN
3.0000 mL | INTRAMUSCULAR | Status: DC | PRN
  Administered 2014-10-28 (×2): 3 mL via INTRAVENOUS
  Filled 2014-10-28 (×2): qty 3

## 2014-10-28 MED ORDER — SODIUM CHLORIDE 0.9 % IV SOLN: 250.0000 mL | INTRAVENOUS | Status: DC | PRN

## 2014-10-28 MED ORDER — GUAIFENESIN ER 600 MG PO TB12: 600.0000 mg | ORAL_TABLET | Freq: Two times a day (BID) | ORAL | Status: AC

## 2014-10-28 NOTE — Progress Notes (Signed)
Discharge instructions and prescriptions given, verbalized understanding, out in stable condition ambulatory with family. 

## 2014-10-28 NOTE — Progress Notes (Signed)
SATURATION QUALIFICATIONS: (This note is used to comply with regulatory documentation for home oxygen)  Patient Saturations on Room Air at Rest = 92%  Patient Saturations on Room Air while Ambulating = 89-93%  Patient Saturations on 0 Liters of oxygen while Ambulating =93%  Please briefly explain why patient needs home oxygen:

## 2014-10-29 NOTE — Discharge Summary (Signed)
Physician Discharge Summary  Alton Revereimothy Dzik ZOX:[id number] DOB: 16-Jan-1976 DOA: 10/25/2014  PCP: No primary care provider on file.  Admit date: 10/25/2014 Discharge date: 10/28/2014  Time spent: 40 minutes  Recommendations for Outpatient Follow-up:  1. Follow up with primary care physician in 1-2 weeks  Discharge Diagnoses:  Principal Problem:   Acute respiratory failure with hypoxia Active Problems:   Acute bronchitis   Leukocytosis   Discharge Condition: improved  Diet recommendation: regular diet  Filed Weights   10/25/14 1926  Weight: 97.523 kg (215 lb)    History of present illness:  This is a 38 y/o male who presented to the hospital with worsening cough, wheezing and shortness of breath. He was found to have an acute bronchitis with no clear infiltrate on chest xray. He had some mild hypoxia on ambulation. He was admitted to the hospital for further management  Hospital Course:  Patient was treated with bronchodilators, steroids and antibiotics. He was given mucolytics and his condition slowly improved. He did not have any fevers during his hospital stay. At the time of discharge, he was able to ambulate around the nursing unit on room air without any significant dyspnea. Oxygen saturations stayed in acceptable range. Patient feels ready for discharge home. He will be started on a prednisone taper, receive prescription for inhaler and antibiotics. He has gained maximal benefit from hospitalization.  Procedures:    Consultations:    Discharge Exam: Filed Vitals:   10/28/14 1400  BP: 121/65  Pulse: 100  Temp: 98.2 F (36.8 C)  Resp: 18    General: NAD Cardiovascular: s1, s2, rrr Respiratory: cta b with no wheezing  Discharge Instructions   Discharge Instructions    Call MD for:  difficulty breathing, headache or visual disturbances    Complete by:  As directed      Call MD for:  severe uncontrolled pain    Complete by:  As directed      Call MD for:   temperature >100.4    Complete by:  As directed      Diet - low sodium heart healthy    Complete by:  As directed      Increase activity slowly    Complete by:  As directed           Discharge Medication List as of 10/28/2014  6:38 PM    START taking these medications   Details  albuterol (PROVENTIL HFA;VENTOLIN HFA) 108 (90 BASE) MCG/ACT inhaler Inhale 2 puffs into the lungs every 4 (four) hours as needed for wheezing or shortness of breath., Starting 10/28/2014, Until Discontinued, Print    guaiFENesin (MUCINEX) 600 MG 12 hr tablet Take 1 tablet (600 mg total) by mouth 2 (two) times daily., Starting 10/28/2014, Until Discontinued, Normal    HYDROcodone-homatropine (HYCODAN) 5-1.5 MG/5ML syrup Take 5 mLs by mouth every 6 (six) hours as needed for cough., Starting 10/28/2014, Until Discontinued, Print    levofloxacin (LEVAQUIN) 750 MG tablet Take 1 tablet (750 mg total) by mouth daily., Starting 10/28/2014, Until Discontinued, Print    predniSONE (DELTASONE) 10 MG tablet Take 40mg  po daily for 2 days then 30mg  po daily for 2 days then 20mg  po daily for 2 days then 10mg  po daily for 2 days then stop, Print      CONTINUE these medications which have NOT CHANGED   Details  ibuprofen (ADVIL,MOTRIN) 200 MG tablet Take 800 mg by mouth every 6 (six) hours as needed for fever, mild pain or moderate pain.,  Until Discontinued, Historical Med    promethazine-dextromethorphan (PROMETHAZINE-DM) 6.25-15 MG/5ML syrup Take 5 mLs by mouth 4 (four) times daily as needed for cough., Until Discontinued, Historical Med      STOP taking these medications     DM-Doxylamine-Acetaminophen (NYQUIL COLD & FLU PO)        No Known Allergies Follow-up Information    Follow up with follow up with primary care doctor in 1-2 weeks.       The results of significant diagnostics from this hospitalization (including imaging, microbiology, ancillary and laboratory) are listed below for reference.     Significant Diagnostic Studies: Dg Chest 2 View  10/25/2014   CLINICAL DATA:  Fever and nonproductive cough for 9 days. Patient reports a history of asthma.  EXAM: CHEST  2 VIEW  COMPARISON:  None.  FINDINGS: The heart size and mediastinal contours are within normal limits. Both lungs are clear. The visualized skeletal structures are unremarkable. Mild central bronchial wall thickening is evident.  IMPRESSION: Mild central bronchial wall thickening which may be related to the history of asthma or bronchitis.   Electronically Signed   By: Christiana PellantGretchen  Green M.D.   On: 10/25/2014 20:19    Microbiology: Recent Results (from the past 240 hour(s))  Rapid strep screen     Status: None   Collection Time: 10/25/14  8:21 PM  Result Value Ref Range Status   Streptococcus, Group A Screen (Direct) NEGATIVE NEGATIVE Final    Comment: (NOTE) A Rapid Antigen test may result negative if the antigen level in the sample is below the detection level of this test. The FDA has not cleared this test as a stand-alone test therefore the rapid antigen negative result has reflexed to a Group A Strep culture.   Culture, Group A Strep     Status: None   Collection Time: 10/25/14  8:21 PM  Result Value Ref Range Status   Specimen Description THROAT  Final   Special Requests NONE  Final   Culture   Final    No Beta Hemolytic Streptococci Isolated [facility]    Report Status 10/27/2014 FINAL  Final  Culture, blood (routine x 2)     Status: None (Preliminary result)   Collection Time: 10/26/14 12:49 AM  Result Value Ref Range Status   Specimen Description BLOOD RIGHT ANTECUBITAL  Final   Special Requests   Final    BOTTLES DRAWN AEROBIC AND ANAEROBIC AEB=8CC ANA=6CC   Culture NO GROWTH 2 DAYS  Final   Report Status PENDING  Incomplete  Culture, blood (routine x 2)     Status: None (Preliminary result)   Collection Time: 10/26/14 12:49 AM  Result Value Ref Range Status   Specimen  Description BLOOD RIGHT HAND  Final   Special Requests BOTTLES DRAWN AEROBIC AND ANAEROBIC [facility]6CC EACH  Final   Culture NO GROWTH 2 DAYS  Final   Report Status PENDING  Incomplete     Labs: Basic Metabolic Panel:  Recent Labs Lab 10/25/14 2032 10/26/14 0049 10/26/14 0628 10/27/14 0637 10/28/14 0647  NA 140  --  138 137 139  K 3.0*  --  3.3* 4.0 3.8  CL 105  --  101 102 103  CO2 29  --  27 25 29   GLUCOSE 102*  --  225* 147* 138*  BUN 10  --  10 15 17   CREATININE 0.78  --  0.71 0.75 0.78  CALCIUM 8.4  --  8.3* 8.7 8.5  MG  --  1.5  --   --   --    Liver Function Tests:  Recent Labs Lab 10/25/14 2032 10/26/14 0628 10/27/14 0637 10/28/14 0647  AST 23 28 36 28  ALT 19 18 22 29   ALKPHOS 49 50 52 43  BILITOT 0.5 0.6 0.7 0.4  PROT 6.9 6.9 6.5 5.8*  ALBUMIN 2.9* 2.7* 2.6* 2.5*   No results for input(s): LIPASE, AMYLASE in the last 168 hours. No results for input(s): AMMONIA in the last 168 hours. CBC:  Recent Labs Lab 10/25/14 2032 10/26/14 0628 10/27/14 0637 10/28/14 0647  WBC 13.6* 12.1* 33.0* 19.4*  NEUTROABS 11.0* 11.7* 30.4* 17.4*  HGB 13.5 13.6 13.7 12.6*  HCT 38.2* 38.2* 37.4* 36.0*  MCV 84.5 87.0 88.6 87.2  PLT 265 279 346 305   Cardiac Enzymes:  Recent Labs Lab 10/25/14 2032  TROPONINI <0.03   BNP: BNP (last 3 results) No results for input(s): PROBNP in the last 8760 hours. CBG: No results for input(s): GLUCAP in the last 168 hours.     Signed:  MEMON,JEHANZEB  [facility] 10/29/2014, 1:32 AM

## 2014-10-31 LAB — RESPIRATORY VIRUS PANEL
ADENOVIRUS: NOT DETECTED
INFLUENZA A H1: NOT DETECTED
INFLUENZA A H3: NOT DETECTED
INFLUENZA A: NOT DETECTED
INFLUENZA B 1: NOT DETECTED
METAPNEUMOVIRUS: NOT DETECTED
PARAINFLUENZA 3 A: NOT DETECTED
Parainfluenza 1: NOT DETECTED
Parainfluenza 2: NOT DETECTED
RESPIRATORY SYNCYTIAL VIRUS B: NOT DETECTED
RHINOVIRUS: NOT DETECTED
Respiratory Syncytial Virus A: NOT DETECTED

## 2014-10-31 LAB — CULTURE, BLOOD (ROUTINE X 2)
Culture: NO GROWTH
Culture: NO GROWTH

## 2014-12-28 ENCOUNTER — Emergency Department (HOSPITAL_COMMUNITY): Payer: Self-pay

## 2014-12-28 ENCOUNTER — Emergency Department (HOSPITAL_COMMUNITY)
Admission: EM | Admit: 2014-12-28 | Discharge: 2014-12-28 | Disposition: A | Discharge status: Home / Self Care | Payer: Self-pay | Attending: Emergency Medicine | Admitting: Emergency Medicine

## 2014-12-28 ENCOUNTER — Encounter (HOSPITAL_COMMUNITY): Payer: Self-pay | Admitting: Emergency Medicine

## 2014-12-28 DIAGNOSIS — J45909 Unspecified asthma, uncomplicated: Secondary | ICD-10-CM | POA: Insufficient documentation

## 2014-12-28 DIAGNOSIS — Z7952 Long term (current) use of systemic steroids: Secondary | ICD-10-CM | POA: Insufficient documentation

## 2014-12-28 DIAGNOSIS — Z792 Long term (current) use of antibiotics: Secondary | ICD-10-CM | POA: Insufficient documentation

## 2014-12-28 DIAGNOSIS — K088 Other specified disorders of teeth and supporting structures: Secondary | ICD-10-CM | POA: Insufficient documentation

## 2014-12-28 DIAGNOSIS — Z79899 Other long term (current) drug therapy: Secondary | ICD-10-CM | POA: Insufficient documentation

## 2014-12-28 DIAGNOSIS — L988 Other specified disorders of the skin and subcutaneous tissue: Secondary | ICD-10-CM | POA: Insufficient documentation

## 2014-12-28 DIAGNOSIS — K029 Dental caries, unspecified: Secondary | ICD-10-CM | POA: Insufficient documentation

## 2014-12-28 DIAGNOSIS — B349 Viral infection, unspecified: Secondary | ICD-10-CM

## 2014-12-28 DIAGNOSIS — K0889 Other specified disorders of teeth and supporting structures: Secondary | ICD-10-CM

## 2014-12-28 LAB — RAPID STREP SCREEN (MED CTR MEBANE ONLY): Streptococcus, Group A Screen (Direct): NEGATIVE

## 2014-12-28 MED ORDER — ACETAMINOPHEN-CODEINE #3 300-30 MG PO TABS: 1.0000 | ORAL_TABLET | Freq: Four times a day (QID) | ORAL | Status: AC | PRN

## 2014-12-28 MED ORDER — IBUPROFEN 800 MG PO TABS
800.0000 mg | ORAL_TABLET | Freq: Once | ORAL | Status: AC
  Administered 2014-12-28: 800 mg via ORAL
  Filled 2014-12-28: qty 1

## 2014-12-28 MED ORDER — IBUPROFEN 800 MG PO TABS: 800.0000 mg | ORAL_TABLET | Freq: Three times a day (TID) | ORAL | Status: DC

## 2014-12-28 MED ORDER — AMOXICILLIN 500 MG PO CAPS
500.0000 mg | ORAL_CAPSULE | Freq: Three times a day (TID) | ORAL | Status: AC
Start: 2014-12-28 — End: ?

## 2014-12-28 NOTE — Discharge Instructions (Signed)
Your strep test is negative. Your chest x-ray is negative for acute event. Your examination suggest viral illness with fever blisters, oral ulcers, and toothache. Please use ibuprofen 3 times daily with food, and Amoxil 3 times daily with food for your tooth pain. Use Tylenol codeine for severe pain. It important that you see a dentist as soon as possible. Tylenol codeine may cause drowsiness, please use with caution. Please apply Orajel to the fever blisters on your lip and inside your mouth. This is contagious, please use her mask until symptoms have resolved. Please wash hands frequently, and please do not share eating utensils. Viral Infections A virus is a type of germ. Viruses can cause:  Minor sore throats.  Aches and pains.  Headaches.  Runny nose.  Rashes.  Watery eyes.  Tiredness.  Coughs.  Loss of appetite.  Feeling sick to your stomach (nausea).  Throwing up (vomiting).  Watery poop (diarrhea). HOME CARE   Only take medicines as told by your doctor.  Drink enough water and fluids to keep your pee (urine) clear or pale yellow. Sports drinks are a good choice.  Get plenty of rest and eat healthy. Soups and broths with crackers or rice are fine. GET HELP RIGHT AWAY IF:   You have a very bad headache.  You have shortness of breath.  You have chest pain or neck pain.  You have an unusual rash.  You cannot stop throwing up.  You have watery poop that does not stop.  You cannot keep fluids down.  You or your child has a temperature by mouth above 102 F (38.9 C), not controlled by medicine.  Your baby is older than 3 months with a rectal temperature of 102 F (38.9 C) or higher.  Your baby is 53 months old or younger with a rectal temperature of 100.4 F (38 C) or higher. MAKE SURE YOU:   Understand these instructions.  Will watch this condition.  Will get help right away if you are not doing well or get worse. Document Released: 10/02/2008  Document Revised: 01/12/2012 Document Reviewed: 02/25/2011 [facility] Patient Information 2015 [place], [place]. This information is not intended to replace advice given to you by your health care provider. Make sure you discuss any questions you have with your health care provider.

## 2014-12-28 NOTE — ED Notes (Signed)
Coughing x 1 week, this week start  having blister on lips, teeth and gums hurting, fever

## 2014-12-28 NOTE — ED Provider Notes (Signed)
CSN: [id number]     Arrival date & time 12/28/14  1206 History   First MD Initiated Contact with Patient 12/28/14 1324     Chief Complaint  Patient presents with  . Cough  . Blister    on mouth     (Consider location/radiation/quality/duration/timing/severity/associated sxs/prior Treatment) HPI Comments: Patient is a 39 year old male who presents to the emergency department with a complaint of cough and generally not feeling well.  The patient states for the past week he has been having problems with cough, blisters on his lip, toothache, and pain in his gum area. He is also noticed some fever subjectively. The patient states he's had nausea, but no vomiting. He has not had any diarrhea, or unusual rash. There been no hot joints reported. The patient states he has not traveled outside of the country recently. He has a history of asthma, but no other problems. He has received only minimal relief from ibuprofen up to this point.  Patient is a 39 y.o. male presenting with cough. The history is provided by the patient.  Cough Associated symptoms: sore throat     Past Medical History  Diagnosis Date  . Asthma    History reviewed. No pertinent past surgical history. Family History  Problem Relation Age of Onset  . Hypertension Father   . Cancer Mother   . Heart attack Other    History  Substance Use Topics  . Smoking status: Never Smoker   . Smokeless tobacco: Not on file  . Alcohol Use: Yes     Comment: occ    Review of Systems  HENT: Positive for dental problem, mouth sores and sore throat.   Respiratory: Positive for cough.   All other systems reviewed and are negative.     Allergies  Review of patient's allergies indicates no known allergies.  Home Medications   Prior to Admission medications   Medication Sig Start Date End Date Taking? Authorizing Provider  albuterol (PROVENTIL HFA;VENTOLIN HFA) 108 (90 BASE) MCG/ACT inhaler Inhale 2 puffs into the lungs every 4  (four) hours as needed for wheezing or shortness of breath. 10/28/14   Erick BlinksJehanzeb Memon, MD  guaiFENesin (MUCINEX) 600 MG 12 hr tablet Take 1 tablet (600 mg total) by mouth 2 (two) times daily. 10/28/14   Erick BlinksJehanzeb Memon, MD  HYDROcodone-homatropine (HYCODAN) 5-1.5 MG/5ML syrup Take 5 mLs by mouth every 6 (six) hours as needed for cough. 10/28/14   Erick BlinksJehanzeb Memon, MD  ibuprofen (ADVIL,MOTRIN) 200 MG tablet Take 800 mg by mouth every 6 (six) hours as needed for fever, mild pain or moderate pain.    Historical Provider, MD  levofloxacin (LEVAQUIN) 750 MG tablet Take 1 tablet (750 mg total) by mouth daily. 10/28/14   Erick BlinksJehanzeb Memon, MD  predniSONE (DELTASONE) 10 MG tablet Take 40mg  po daily for 2 days then 30mg  po daily for 2 days then 20mg  po daily for 2 days then 10mg  po daily for 2 days then stop 10/28/14   Erick BlinksJehanzeb Memon, MD  promethazine-dextromethorphan (PROMETHAZINE-DM) 6.25-15 MG/5ML syrup Take 5 mLs by mouth 4 (four) times daily as needed for cough.    Historical Provider, MD   BP 137/72 mmHg  Pulse 100  Temp(Src) 101.4 F (38.6 C) (Oral)  Resp 18  Ht 5\' 9"  (1.753 m)  Wt 179 lb (81.194 kg)  BMI 26.42 kg/m2  SpO2 98% Physical Exam  Constitutional: He is oriented to person, place, and time. He appears well-developed and well-nourished.  Non-toxic appearance.  HENT:  Head:  Normocephalic.  Right Ear: Tympanic membrane and external ear normal.  Left Ear: Tympanic membrane and external ear normal.  There are fever blisters of the upper lip. There are blisters noted of the posterior pharynx. The uvula is in the midline, but with increased redness and some swelling. There is increased redness of the posterior pharynx.  Dental caries noted. No abscess. No swelling under the tongue.  Eyes: EOM and lids are normal. Pupils are equal, round, and reactive to light.  Neck: Normal range of motion. Neck supple. Carotid bruit is not present.  Cardiovascular: Normal rate, regular rhythm, normal heart  sounds, intact distal pulses and normal pulses.   Pulmonary/Chest: Breath sounds normal. No respiratory distress.  Abdominal: Soft. Bowel sounds are normal. There is no tenderness. There is no guarding.  Musculoskeletal: Normal range of motion.  Lymphadenopathy:       Head (right side): No submandibular adenopathy present.       Head (left side): No submandibular adenopathy present.    He has no cervical adenopathy.  Neurological: He is alert and oriented to person, place, and time. He has normal strength. No cranial nerve deficit or sensory deficit.  Skin: Skin is warm and dry.  Psychiatric: He has a normal mood and affect. His speech is normal.  Nursing note and vitals reviewed.   ED Course  Procedures (including critical care time) Labs Review Labs Reviewed  RAPID STREP SCREEN    Imaging Review Dg Chest 2 View  12/28/2014   CLINICAL DATA:  Cough for 1 week, fever  EXAM: CHEST  2 VIEW  COMPARISON:  10/25/2014  FINDINGS: Cardiomediastinal silhouette is stable. No acute infiltrate or pleural effusion. No pulmonary edema. Mild gaseous distension of splenic flexure of the colon.  IMPRESSION: No active disease. Mild gaseous distension of splenic flexure of the colon.   Electronically Signed   By: Natasha Mead M.D.   On: 12/28/2014 13:32     EKG Interpretation None      MDM  Pt has had a cough for a week. Temp elevated. Pulse ox 98% on room air. Pt also has dental issues, but no evidence for Ludwig's Angina. Chest xray reveals no active disease. Strep screen is negative.  Pt made aware of findings. Pt to follow up with primary MD and see a dentist as soon as possible. Rx for amoxil, tylenol #3, and ibuprofen given to the patient.    Final diagnoses:  Viral illness  Toothache    *I have reviewed nursing notes, vital signs, and all appropriate lab and imaging results for this patient.[address] PA-C 12/30/14 1509  Audree Camel, MD 01/03/15 [phone]

## 2014-12-30 LAB — CULTURE, GROUP A STREP: Strep A Culture: NEGATIVE

## 2018-04-27 ENCOUNTER — Emergency Department (HOSPITAL_COMMUNITY)
Admission: EM | Admit: 2018-04-27 | Discharge: 2018-04-27 | Disposition: A | Discharge status: Home / Self Care | Payer: 59 | Attending: Emergency Medicine | Admitting: Emergency Medicine

## 2018-04-27 ENCOUNTER — Other Ambulatory Visit: Payer: Self-pay

## 2018-04-27 ENCOUNTER — Encounter (HOSPITAL_COMMUNITY): Payer: Self-pay | Admitting: Emergency Medicine

## 2018-04-27 DIAGNOSIS — J45909 Unspecified asthma, uncomplicated: Secondary | ICD-10-CM | POA: Diagnosis not present

## 2018-04-27 DIAGNOSIS — K029 Dental caries, unspecified: Secondary | ICD-10-CM | POA: Diagnosis not present

## 2018-04-27 DIAGNOSIS — K0889 Other specified disorders of teeth and supporting structures: Secondary | ICD-10-CM | POA: Diagnosis present

## 2018-04-27 DIAGNOSIS — Z79899 Other long term (current) drug therapy: Secondary | ICD-10-CM | POA: Insufficient documentation

## 2018-04-27 DIAGNOSIS — K047 Periapical abscess without sinus: Secondary | ICD-10-CM | POA: Diagnosis not present

## 2018-04-27 MED ORDER — KETOROLAC TROMETHAMINE 60 MG/2ML IM SOLN
60.0000 mg | Freq: Once | INTRAMUSCULAR | Status: AC
  Administered 2018-04-27: 60 mg via INTRAMUSCULAR
  Filled 2018-04-27: qty 2

## 2018-04-27 MED ORDER — OXYCODONE-ACETAMINOPHEN 5-325 MG PO TABS
1.0000 | ORAL_TABLET | Freq: Once | ORAL | Status: DC
  Filled 2018-04-27: qty 1

## 2018-04-27 MED ORDER — IBUPROFEN 800 MG PO TABS: 800.0000 mg | ORAL_TABLET | Freq: Three times a day (TID) | ORAL | 0 refills | Status: AC

## 2018-04-27 MED ORDER — PENICILLIN V POTASSIUM 250 MG PO TABS
500.0000 mg | ORAL_TABLET | Freq: Once | ORAL | Status: AC
  Administered 2018-04-27: 500 mg via ORAL
  Filled 2018-04-27: qty 2

## 2018-04-27 MED ORDER — PENICILLIN V POTASSIUM 500 MG PO TABS: 500.0000 mg | ORAL_TABLET | Freq: Three times a day (TID) | ORAL | 0 refills | Status: AC

## 2018-04-27 NOTE — ED Triage Notes (Signed)
Pt c/o right lower jaw pain with swelling since yesterday.

## 2018-04-27 NOTE — ED Provider Notes (Signed)
[facility] EMERGENCY DEPARTMENT Provider Note   CSN: [id number] Arrival date & time: 04/27/18  2014     History   Chief Complaint Chief Complaint  Patient presents with  . Dental Pain    HPI Brad Strickland is a 42 y.o. male.  The history is provided by the patient.  Dental Pain   This is a new problem. The current episode started yesterday. The problem occurs constantly. The problem has been gradually worsening. The pain is moderate. Treatments tried: ibuprofen.    Past Medical History:  Diagnosis Date  . Asthma     Patient Active Problem List   Diagnosis Date Noted  . Leukocytosis 10/27/2014  . Acute bronchitis 10/26/2014  . Acute respiratory failure with hypoxia (HCC) 10/26/2014    History reviewed. No pertinent surgical history.      Home Medications    Prior to Admission medications   Medication Sig Start Date End Date Taking? Authorizing Provider  acetaminophen-codeine (TYLENOL #3) 300-30 MG per tablet Take 1-2 tablets by mouth every 6 (six) hours as needed. 12/28/14   Ivery Quale, PA-C  albuterol (PROVENTIL HFA;VENTOLIN HFA) 108 (90 BASE) MCG/ACT inhaler Inhale 2 puffs into the lungs every 4 (four) hours as needed for wheezing or shortness of breath. 10/28/14   Erick Blinks, MD  amoxicillin (AMOXIL) 500 MG capsule Take 1 capsule (500 mg total) by mouth 3 (three) times daily. 12/28/14   Ivery Quale, PA-C  guaiFENesin (MUCINEX) 600 MG 12 hr tablet Take 1 tablet (600 mg total) by mouth 2 (two) times daily. 10/28/14   Erick Blinks, MD  HYDROcodone-homatropine (HYCODAN) 5-1.5 MG/5ML syrup Take 5 mLs by mouth every 6 (six) hours as needed for cough. 10/28/14   Erick Blinks, MD  ibuprofen (ADVIL,MOTRIN) 800 MG tablet Take 1 tablet (800 mg total) by mouth 3 (three) times daily. 04/27/18   Ayvah Caroll, Barbara Cower, MD  levofloxacin (LEVAQUIN) 750 MG tablet Take 1 tablet (750 mg total) by mouth daily. 10/28/14   Erick Blinks, MD  penicillin v potassium (VEETID) 500  MG tablet Take 1 tablet (500 mg total) by mouth 3 (three) times daily. 04/27/18   Isobel Eisenhuth, Barbara Cower, MD  predniSONE (DELTASONE) 10 MG tablet Take 40mg  po daily for 2 days then 30mg  po daily for 2 days then 20mg  po daily for 2 days then 10mg  po daily for 2 days then stop 10/28/14   Erick Blinks, MD  promethazine-dextromethorphan (PROMETHAZINE-DM) 6.25-15 MG/5ML syrup Take 5 mLs by mouth 4 (four) times daily as needed for cough.    [provider]    Family History Family History  Problem Relation Age of Onset  . Hypertension Father   . Cancer Mother   . Heart attack Other     Social History Social History   Tobacco Use  . Smoking status: Never Smoker  . Smokeless tobacco: Never Used  Substance Use Topics  . Alcohol use: Yes    Comment: occ  . Drug use: No     Allergies   Patient has no known allergies.   Review of Systems Review of Systems  All other systems reviewed and are negative.    Physical Exam Updated Vital Signs BP 102/74 (BP Location: Right Arm)   Pulse 99   Temp 97.8 F (36.6 C) (Temporal)   Resp 14   Ht 5\' 9"  (1.753 m)   Wt 93.4 kg (206 lb)   SpO2 97%   BMI 30.42 kg/m   Physical Exam  Constitutional: He is oriented to person,  place, and time. He appears well-developed and well-nourished.  HENT:  Head: Normocephalic and atraumatic.  Mouth/Throat:    Tenderness below second molar on right lower jaw without fluctuance.  No sublingual induration.   Eyes: Conjunctivae and EOM are normal.  Neck: Normal range of motion.  Cardiovascular: Normal rate.  Pulmonary/Chest: Effort normal. No respiratory distress.  Abdominal: Soft. He exhibits no distension.  Musculoskeletal: Normal range of motion.  Neurological: He is alert and oriented to person, place, and time.  Skin: Skin is warm.  Nursing note and vitals reviewed.    ED Treatments / Results  Labs (all labs ordered are listed, but only abnormal results are displayed) Labs Reviewed - No  data to display  EKG None  Radiology No results found.  Procedures Procedures (including critical care time)  Medications Ordered in ED Medications  oxyCODONE-acetaminophen (PERCOCET/ROXICET) 5-325 MG per tablet 1 tablet (1 tablet Oral Not Given 04/27/18 2105)  ketorolac (TORADOL) injection 60 mg (60 mg Intramuscular Given 04/27/18 2104)  penicillin v potassium (VEETID) tablet 500 mg (500 mg Oral Given 04/27/18 2104)     Initial Impression / Assessment and Plan / ED Course  I have reviewed the triage vital signs and the nursing notes.  Pertinent labs & imaging results that were available during my care of the patient were reviewed by me and considered in my medical decision making (see chart for details).     Likely dental infection, will treat for same. No ludwigs. No airway compromise. No other complications. Will start abx, suggest dental follow up.   Final Clinical Impressions(s) / ED Diagnoses   Final diagnoses:  Dental caries  Dental infection    ED Discharge Orders        Ordered    penicillin v potassium (VEETID) 500 MG tablet  3 times daily     04/27/18 2053    ibuprofen (ADVIL,MOTRIN) 800 MG tablet  3 times daily     04/27/18 2053       Odelia Graciano, Barbara CowerJason, MD 04/27/18 2113

## 2019-03-10 ENCOUNTER — Other Ambulatory Visit: Payer: Self-pay

## 2019-03-10 ENCOUNTER — Emergency Department (HOSPITAL_BASED_OUTPATIENT_CLINIC_OR_DEPARTMENT_OTHER): Payer: 59

## 2019-03-10 ENCOUNTER — Encounter (HOSPITAL_BASED_OUTPATIENT_CLINIC_OR_DEPARTMENT_OTHER): Payer: Self-pay

## 2019-03-10 ENCOUNTER — Emergency Department (HOSPITAL_BASED_OUTPATIENT_CLINIC_OR_DEPARTMENT_OTHER)
Admission: EM | Admit: 2019-03-10 | Discharge: 2019-03-10 | Disposition: A | Discharge status: Home / Self Care | Payer: 59 | Attending: Emergency Medicine | Admitting: Emergency Medicine

## 2019-03-10 DIAGNOSIS — Z20822 Contact with and (suspected) exposure to covid-19: Secondary | ICD-10-CM

## 2019-03-10 DIAGNOSIS — R0602 Shortness of breath: Secondary | ICD-10-CM | POA: Insufficient documentation

## 2019-03-10 DIAGNOSIS — Z20828 Contact with and (suspected) exposure to other viral communicable diseases: Secondary | ICD-10-CM | POA: Insufficient documentation

## 2019-03-10 DIAGNOSIS — R6889 Other general symptoms and signs: Principal | ICD-10-CM

## 2019-03-10 DIAGNOSIS — J45909 Unspecified asthma, uncomplicated: Secondary | ICD-10-CM | POA: Insufficient documentation

## 2019-03-10 DIAGNOSIS — Z79899 Other long term (current) drug therapy: Secondary | ICD-10-CM | POA: Diagnosis not present

## 2019-03-10 DIAGNOSIS — R05 Cough: Secondary | ICD-10-CM | POA: Insufficient documentation

## 2019-03-10 MED ORDER — BENZONATATE 100 MG PO CAPS: 100.0000 mg | ORAL_CAPSULE | Freq: Three times a day (TID) | ORAL | 0 refills | Status: AC

## 2019-03-10 NOTE — ED Triage Notes (Signed)
C/o cough, decreased sense smell and tastes x 2 days-pt with covid 19 + co-worker-states he was sent by employer-pt had evist yesterday with dx allergies-rx inhaler, flonase and zyrtec-NAD-steady gait

## 2019-03-10 NOTE — ED Notes (Signed)
Pt denies any fevers, states he is unsure as to when he was exposed at work to [employer], but that 4 other coworkers also tested positive

## 2019-03-10 NOTE — ED Provider Notes (Signed)
[facility] EMERGENCY DEPARTMENT Provider Note   CSN: [id number] Arrival date & time: 03/10/19  1725    History   Chief Complaint Chief Complaint  Patient presents with  . Cough    HPI Brad Strickland is a 43 y.o. male.     43 yo M with a chief complaint of cough and shortness of breath.  This been off and on for the past couple weeks.  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  His employer found out that he had been coughing and sent him here for evaluation.  He denies any fevers.  Denies nausea vomiting diarrhea denies congestion denies sore throat denies ear pain.  Denies other exposure.  The history is provided by the patient.  Cough  Cough characteristics:  Non-productive Associated symptoms: shortness of breath   Associated symptoms: no chest pain, no chills, no eye discharge, no fever, no headaches, no myalgias and no rash   Illness  Severity:  Mild Onset quality:  Sudden Duration:  2 weeks Timing:  Constant Progression:  Worsening Chronicity:  New Associated symptoms: cough and shortness of breath   Associated symptoms: no abdominal pain, no chest pain, no congestion, no diarrhea, no fever, no headaches, no myalgias, no rash and no vomiting     Past Medical History:  Diagnosis Date  . Asthma     Patient Active Problem List   Diagnosis Date Noted  . Leukocytosis 10/27/2014  . Acute bronchitis 10/26/2014  . Acute respiratory failure with hypoxia (HCC) 10/26/2014    History reviewed. No pertinent surgical history.      Home Medications    Prior to Admission medications   Medication Sig Start Date End Date Taking? Authorizing Provider  acetaminophen-codeine (TYLENOL #3) 300-30 MG per tablet Take 1-2 tablets by mouth every 6 (six) hours as needed. 12/28/14   Ivery Quale, PA-C  albuterol (PROVENTIL HFA;VENTOLIN HFA) 108 (90 BASE) MCG/ACT inhaler  Inhale 2 puffs into the lungs every 4 (four) hours as needed for wheezing or shortness of breath. 10/28/14   Erick Blinks, MD  amoxicillin (AMOXIL) 500 MG capsule Take 1 capsule (500 mg total) by mouth 3 (three) times daily. 12/28/14   Ivery Quale, PA-C  guaiFENesin (MUCINEX) 600 MG 12 hr tablet Take 1 tablet (600 mg total) by mouth 2 (two) times daily. 10/28/14   Erick Blinks, MD  HYDROcodone-homatropine (HYCODAN) 5-1.5 MG/5ML syrup Take 5 mLs by mouth every 6 (six) hours as needed for cough. 10/28/14   Erick Blinks, MD  ibuprofen (ADVIL,MOTRIN) 800 MG tablet Take 1 tablet (800 mg total) by mouth 3 (three) times daily. 04/27/18   Mesner, Barbara Cower, MD  levofloxacin (LEVAQUIN) 750 MG tablet Take 1 tablet (750 mg total) by mouth daily. 10/28/14   Erick Blinks, MD  penicillin v potassium (VEETID) 500 MG tablet Take 1 tablet (500 mg total) by mouth 3 (three) times daily. 04/27/18   Mesner, Barbara Cower, MD  predniSONE (DELTASONE) 10 MG tablet Take 40mg  po daily for 2 days then 30mg  po daily for 2 days then 20mg  po daily for 2 days then 10mg  po daily for 2 days then stop 10/28/14   Erick Blinks, MD  promethazine-dextromethorphan (PROMETHAZINE-DM) 6.25-15 MG/5ML syrup Take 5 mLs by mouth 4 (four) times daily as needed for cough.    [provider]    Family History Family History  Problem Relation Age of Onset  .  Hypertension Father   . Cancer Mother   . Heart attack Other     Social History Social History   Tobacco Use  . Smoking status: Never Smoker  . Smokeless tobacco: Never Used  Substance Use Topics  . Alcohol use: Yes    Comment: occ  . Drug use: No     Allergies   Patient has no known allergies.   Review of Systems Review of Systems  Constitutional: Negative for chills and fever.  HENT: Negative for congestion and facial swelling.   Eyes: Negative for discharge and visual disturbance.  Respiratory: Positive for cough and shortness of breath.   Cardiovascular:  Negative for chest pain and palpitations.  Gastrointestinal: Negative for abdominal pain, diarrhea and vomiting.  Musculoskeletal: Negative for arthralgias and myalgias.  Skin: Negative for color change and rash.  Neurological: Negative for tremors, syncope and headaches.  Psychiatric/Behavioral: Negative for confusion and dysphoric mood.     Physical Exam Updated Vital Signs BP (!) 147/89 (BP Location: Right Arm)   Pulse 65   Temp 98.2 F (36.8 C)   Resp 16   Ht 5\' 9"  (1.753 m)   Wt 96.2 kg   SpO2 100%   BMI 31.31 kg/m   Physical Exam Vitals signs and nursing note reviewed.  Constitutional:      Appearance: He is well-developed.  HENT:     Head: Normocephalic and atraumatic.  Eyes:     Pupils: Pupils are equal, round, and reactive to light.  Neck:     Musculoskeletal: Normal range of motion and neck supple.     Vascular: No JVD.  Cardiovascular:     Rate and Rhythm: Normal rate and regular rhythm.     Heart sounds: No murmur. No friction rub. No gallop.   Pulmonary:     Effort: No respiratory distress.     Breath sounds: No wheezing.  Abdominal:     General: There is no distension.     Tenderness: There is no guarding or rebound.  Musculoskeletal: Normal range of motion.  Skin:    Coloration: Skin is not pale.     Findings: No rash.  Neurological:     Mental Status: He is alert and oriented to person, place, and time.  Psychiatric:        Behavior: Behavior normal.      ED Treatments / Results  Labs (all labs ordered are listed, but only abnormal results are displayed) Labs Reviewed  NOVEL CORONAVIRUS, NAA (HOSPITAL ORDER, SEND-OUT TO REF LAB)    EKG None  Radiology No results found.  Procedures Procedures (including critical care time)  Medications Ordered in ED Medications - No data to display   Initial Impression / Assessment and Plan / ED Course  I have reviewed the triage vital signs and the nursing notes.  Pertinent labs & imaging  results that were available during my care of the patient were reviewed by me and considered in my medical decision making (see chart for details).        43 yo M with a chief complaint of a known exposure to someone with the novel coronavirus and cough and shortness of breath.  Patient is well-appearing and nontoxic he is satting 100% on room air there is no tachypnea.  No tachycardia.  We will send an outpatient test.  Chest x-ray.  Tessalon Perles.  Chest x-ray viewed by me without focal infiltrate. The patient will follow-up as an outpatient.  7:03 PM:  I have  discussed the diagnosis/risks/treatment options with the patient and believe the pt to be eligible for discharge home to follow-up with PCP. We also discussed returning to the ED immediately if new or worsening sx occur. We discussed the sx which are most concerning (e.g., sudden worsening sob) that necessitate immediate return. Medications administered to the patient during their visit and any new prescriptions provided to the patient are listed below.  Medications given during this visit Medications - No data to display   The patient appears reasonably screen and/or stabilized for discharge and I doubt any other medical condition or other [facility] requiring further screening, evaluation, or treatment in the ED at this time prior to discharge.    Final Clinical Impressions(s) / ED Diagnoses   Final diagnoses:  None    ED Discharge Orders    None       Melene Plan, DO 03/10/19 1903

## 2019-03-10 NOTE — ED Notes (Signed)
Pt verbalizes understanding of d/c instructions and denies any further needs at this time. 

## 2019-03-10 NOTE — ED Notes (Signed)
Filled out CDC paperwork, pt signed agreement to self-quarantine until further notice.

## 2019-03-10 NOTE — Discharge Instructions (Signed)
Your cxr didn't show pneumonia.  Your symptoms are concerning for the novel coronavirus.  Please practice social distancing.  Your test has been sent off and should return in 1 to 2 days.     Person Under Monitoring Name: Brad Strickland  Location: [address] [place] [address]   Infection Prevention Recommendations for Individuals Confirmed to have, or Being Evaluated for, 2019 Novel Coronavirus (COVID-19) Infection Who Receive Care at Home  Individuals who are confirmed to have, or are being evaluated for, COVID-19 should follow the prevention steps below until a healthcare provider or local or state health department says they can return to normal activities.  Stay home except to get medical care You should restrict activities outside your home, except for getting medical care. Do not go to work, school, or public areas, and do not use public transportation or taxis.  Call ahead before visiting your doctor Before your medical appointment, call the healthcare provider and tell them that you have, or are being evaluated for, COVID-19 infection. This will help the healthcare providers office take steps to keep other people from getting infected. Ask your healthcare provider to call the local or state health department.  Monitor your symptoms Seek prompt medical attention if your illness is worsening (e.g., difficulty breathing). Before going to your medical appointment, call the healthcare provider and tell them that you have, or are being evaluated for, COVID-19 infection. Ask your healthcare provider to call the local or state health department.  Wear a facemask You should wear a facemask that covers your nose and mouth when you are in the same room with other people and when you visit a healthcare provider. People who live with or visit you should also wear a facemask while they are in the same room with you.  Separate yourself from other people in your home As much as  possible, you should stay in a different room from other people in your home. Also, you should use a separate bathroom, if available.  Avoid sharing household items You should not share dishes, drinking glasses, cups, eating utensils, towels, bedding, or other items with other people in your home. After using these items, you should wash them thoroughly with soap and water.  Cover your coughs and sneezes Cover your mouth and nose with a tissue when you cough or sneeze, or you can cough or sneeze into your sleeve. Throw used tissues in a lined trash can, and immediately wash your hands with soap and water for at least 20 seconds or use an alcohol-based hand rub.  Wash your [employer] your hands often and thoroughly with soap and water for at least 20 seconds. You can use an alcohol-based hand sanitizer if soap and water are not available and if your hands are not visibly dirty. Avoid touching your eyes, nose, and mouth with unwashed hands.   Prevention Steps for Caregivers and Household Members of Individuals Confirmed to have, or Being Evaluated for, COVID-19 Infection Being Cared for in the Home  If you live with, or provide care at home for, a person confirmed to have, or being evaluated for, COVID-19 infection please follow these guidelines to prevent infection:  Follow healthcare providers instructions Make sure that you understand and can help the patient follow any healthcare provider instructions for all care.  Provide for the patients basic needs You should help the patient with basic needs in the home and provide support for getting groceries, prescriptions, and other personal needs.  Monitor  the patients symptoms If they are getting sicker, call his or her medical provider and tell them that the patient has, or is being evaluated for, COVID-19 infection. This will help the healthcare providers office take steps to keep other people from getting infected. Ask the  healthcare provider to call the local or state health department.  Limit the number of people who have contact with the patient If possible, have only one caregiver for the patient. Other household members should stay in another home or place of residence. If this is not possible, they should stay in another room, or be separated from the patient as much as possible. Use a separate bathroom, if available. Restrict visitors who do not have an essential need to be in the home.  Keep older adults, very young children, and other sick people away from the patient Keep older adults, very young children, and those who have compromised immune systems or chronic health conditions away from the patient. This includes people with chronic heart, lung, or kidney conditions, diabetes, and cancer.  Ensure good ventilation Make sure that shared spaces in the home have good air flow, such as from an air conditioner or an opened window, weather permitting.  Wash your hands often Wash your hands often and thoroughly with soap and water for at least 20 seconds. You can use an alcohol based hand sanitizer if soap and water are not available and if your hands are not visibly dirty. Avoid touching your eyes, nose, and mouth with unwashed hands. Use disposable paper towels to dry your hands. If not available, use dedicated cloth towels and replace them when they become wet.  Wear a facemask and gloves Wear a disposable facemask at all times in the room and gloves when you touch or have contact with the patients blood, body fluids, and/or secretions or excretions, such as sweat, saliva, sputum, nasal mucus, vomit, urine, or feces.  Ensure the mask fits over your nose and mouth tightly, and do not touch it during use. Throw out disposable facemasks and gloves after using them. Do not reuse. Wash your hands immediately after removing your facemask and gloves. If your personal clothing becomes contaminated, carefully  remove clothing and launder. Wash your hands after handling contaminated clothing. Place all used disposable facemasks, gloves, and other waste in a lined container before disposing them with other household waste. Remove gloves and wash your hands immediately after handling these items.  Do not share dishes, glasses, or other household items with the patient Avoid sharing household items. You should not share dishes, drinking glasses, cups, eating utensils, towels, bedding, or other items with a patient who is confirmed to have, or being evaluated for, COVID-19 infection. After the person uses these items, you should wash them thoroughly with soap and water.  Wash laundry thoroughly Immediately remove and wash clothes or bedding that have blood, body fluids, and/or secretions or excretions, such as sweat, saliva, sputum, nasal mucus, vomit, urine, or feces, on them. Wear gloves when handling laundry from the patient. Read and follow directions on labels of laundry or clothing items and detergent. In general, wash and dry with the warmest temperatures recommended on the label.  Clean all areas the individual has used often Clean all touchable surfaces, such as counters, tabletops, doorknobs, bathroom fixtures, toilets, phones, keyboards, tablets, and bedside tables, every day. Also, clean any surfaces that may have blood, body fluids, and/or secretions or excretions on them. Wear gloves when cleaning surfaces the patient has come  in contact with. Use a diluted bleach solution (e.g., dilute bleach with 1 part bleach and 10 parts water) or a household disinfectant with a label that says EPA-registered for coronaviruses. To make a bleach solution at home, add 1 tablespoon of bleach to 1 quart (4 cups) of water. For a larger supply, add  cup of bleach to 1 gallon (16 cups) of water. Read labels of cleaning products and follow recommendations provided on product labels. Labels contain instructions for  safe and effective use of the cleaning product including precautions you should take when applying the product, such as wearing gloves or eye protection and making sure you have good ventilation during use of the product. Remove gloves and wash hands immediately after cleaning.  Monitor yourself for signs and symptoms of illness Caregivers and household members are considered close contacts, should monitor their health, and will be asked to limit movement outside of the home to the extent possible. Follow the monitoring steps for close contacts listed on the symptom monitoring form.   ? If you have additional questions, contact your local health department or call the epidemiologist on call at [phone] (available 24/7). ? This guidance is subject to change. For the most up-to-date guidance from [facility], please refer to their website: YouBlogs.pl

## 2019-03-14 LAB — NOVEL CORONAVIRUS, NAA (HOSP ORDER, SEND-OUT TO REF LAB; TAT 18-24 HRS): SARS-CoV-2, NAA: DETECTED — AB

## 2019-03-29 ENCOUNTER — Emergency Department (HOSPITAL_COMMUNITY): Admission: EM | Admit: 2019-03-29 | Discharge: 2019-03-29 | Discharge status: Against Medical Advice | Payer: 59

## 2019-03-29 ENCOUNTER — Telehealth: Payer: Self-pay

## 2019-03-29 ENCOUNTER — Other Ambulatory Visit: Payer: Self-pay | Admitting: Internal Medicine

## 2019-03-29 ENCOUNTER — Other Ambulatory Visit: Payer: Self-pay

## 2019-03-29 ENCOUNTER — Other Ambulatory Visit: Payer: 59

## 2019-03-29 DIAGNOSIS — Z20822 Contact with and (suspected) exposure to covid-19: Secondary | ICD-10-CM

## 2019-03-29 NOTE — ED Triage Notes (Signed)
Pt wanted to have covid-19 testing performed to see if he was now negative. Pt reports he was positive on May 9. Pt instructed to go to drive thru testing on [address] to have this performed per Georgette Shell, RN, [facility].

## 2019-03-29 NOTE — Telephone Encounter (Signed)
rec'd call from [employer]; spoke with Reba; requested to schedule pt for COVID 19 retest.  Pt. Tested positive about 3 weeks ago, and now needs retest, to be able to return to work.  Pt. Is at the test site now.  Scheduled appt. Today.

## 2019-04-01 LAB — NOVEL CORONAVIRUS, NAA: SARS-CoV-2, NAA: NOT DETECTED

## 2019-04-03 ENCOUNTER — Telehealth: Payer: Self-pay

## 2019-04-03 NOTE — Telephone Encounter (Signed)
Pt notified that Covid 19 test dated 03/29/19 result indicates it was not detected. Pt verbalized understanding.

## 2019-04-06 ENCOUNTER — Telehealth: Payer: Self-pay | Admitting: *Deleted

## 2019-04-06 NOTE — Telephone Encounter (Signed)
I called pt to request that he donate plasma since he is recovered from the COVID-19.    I left a voicemail asking him to return the call and what it was in reference to.   Plasma donation to assist in the recovery of others with COVID-19.  Left number [phone].

## 2019-12-21 IMAGING — DX PORTABLE CHEST - 1 VIEW
1 series · 1 of 1 positions shown · non-contrast
Comparison: 12/28/2014

CLINICAL DATA: Cough

EXAM:
PORTABLE CHEST 1 VIEW

[chest ap]
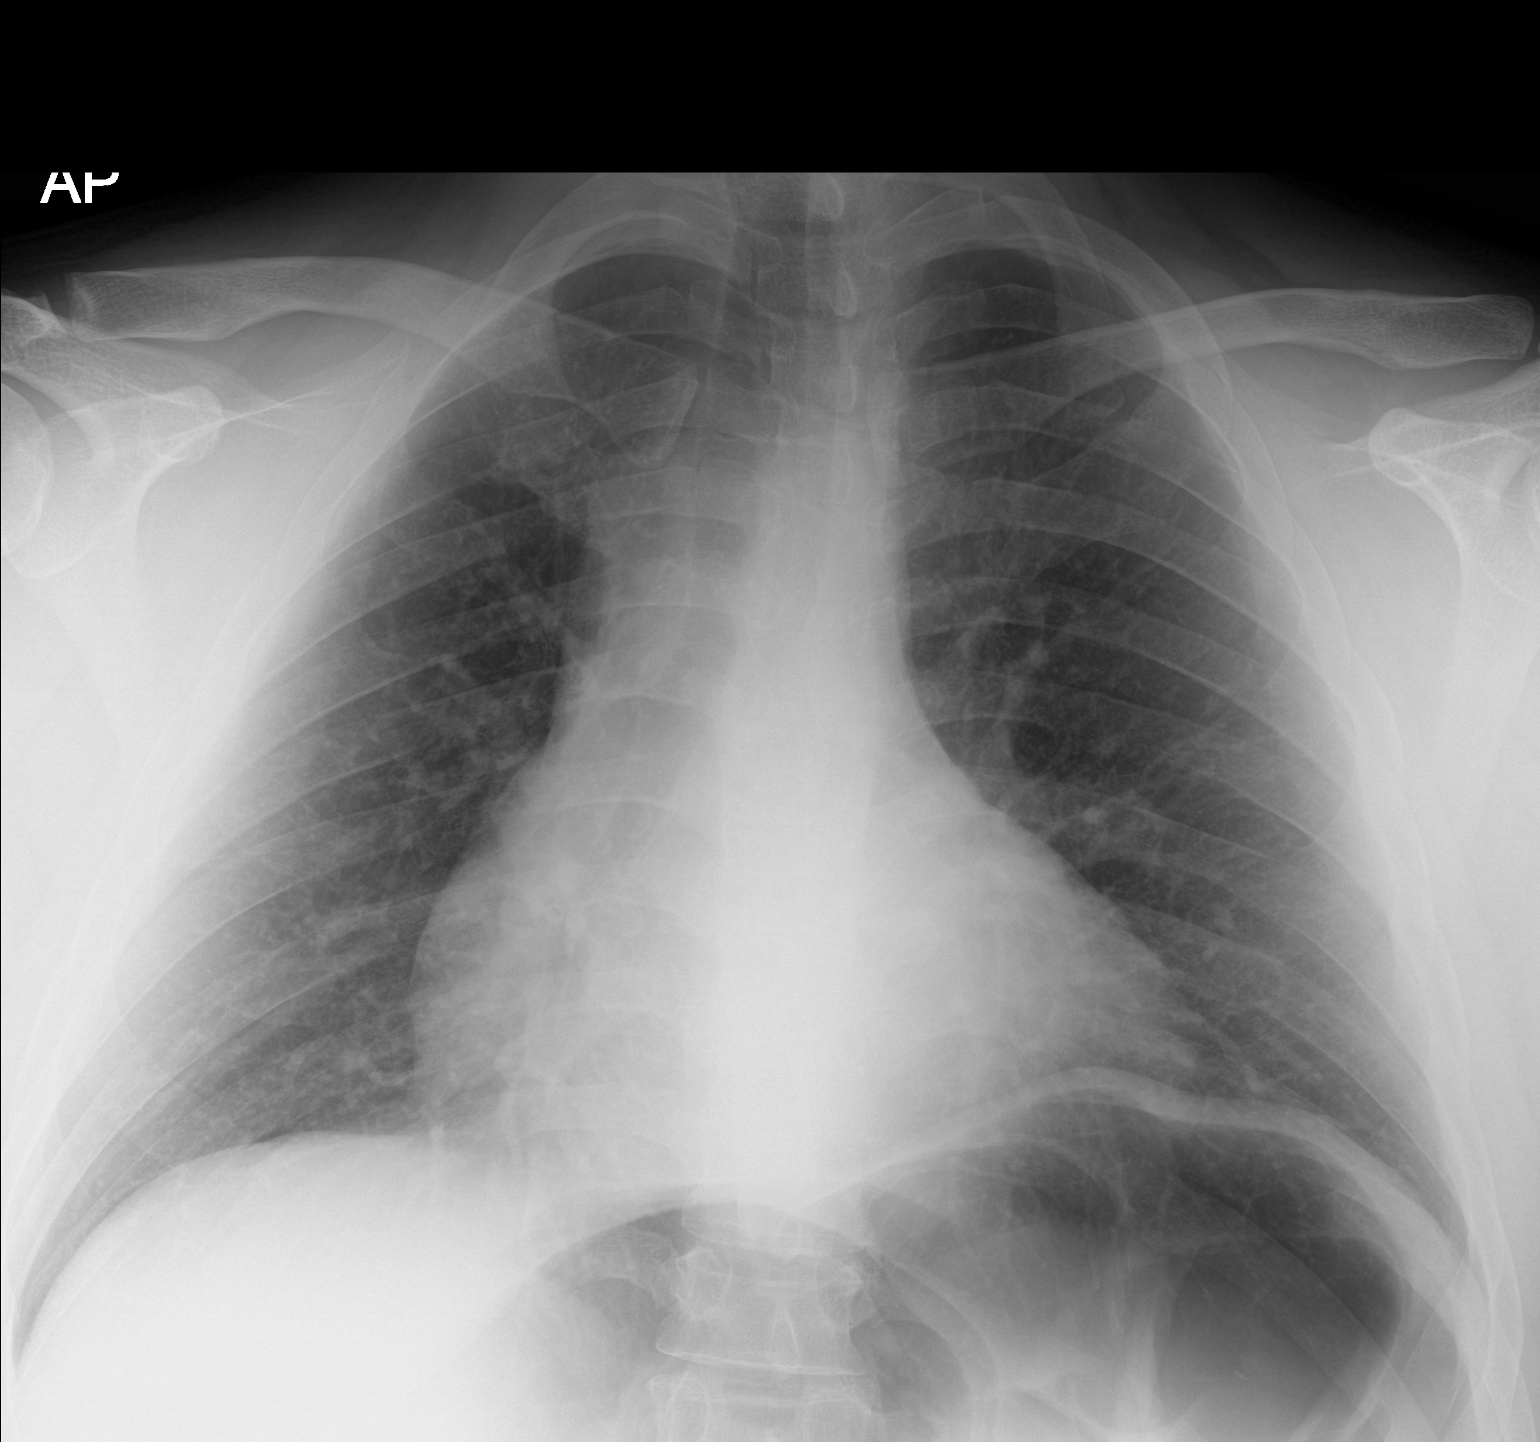

[1 of 1 positions shown; findings below may reference images not displayed]

FINDINGS: Borderline cardiomegaly. No focal airspace disease or effusion. No
pneumothorax.
IMPRESSION: No active disease.  Borderline cardiomegaly.

## 2020-08-27 ENCOUNTER — Other Ambulatory Visit: Payer: 59

## 2020-08-27 DIAGNOSIS — Z20822 Contact with and (suspected) exposure to covid-19: Secondary | ICD-10-CM

## 2020-08-28 LAB — NOVEL CORONAVIRUS, NAA: SARS-CoV-2, NAA: NOT DETECTED

## 2020-08-28 LAB — SARS-COV-2, NAA 2 DAY TAT
